# Patient Record
Sex: Male | Born: 1935 | Race: White | Hispanic: No | Marital: Married | Smoking: Never smoker
Health system: Southern US, Community
[De-identification: ages and names within clinical notes are randomized; demographics above are authoritative.]

## PROBLEM LIST (undated history)

## (undated) DIAGNOSIS — M1990 Unspecified osteoarthritis, unspecified site: Secondary | ICD-10-CM

## (undated) DIAGNOSIS — I2510 Atherosclerotic heart disease of native coronary artery without angina pectoris: Secondary | ICD-10-CM

## (undated) DIAGNOSIS — N189 Chronic kidney disease, unspecified: Secondary | ICD-10-CM

## (undated) DIAGNOSIS — E785 Hyperlipidemia, unspecified: Secondary | ICD-10-CM

## (undated) DIAGNOSIS — I714 Abdominal aortic aneurysm, without rupture, unspecified: Secondary | ICD-10-CM

## (undated) DIAGNOSIS — I10 Essential (primary) hypertension: Secondary | ICD-10-CM

## (undated) DIAGNOSIS — K219 Gastro-esophageal reflux disease without esophagitis: Secondary | ICD-10-CM

## (undated) DIAGNOSIS — I219 Acute myocardial infarction, unspecified: Secondary | ICD-10-CM

## (undated) HISTORY — DX: Atherosclerotic heart disease of native coronary artery without angina pectoris: I25.10

## (undated) HISTORY — DX: Abdominal aortic aneurysm, without rupture, unspecified: I71.40

## (undated) HISTORY — PX: FRACTURE SURGERY: SHX138

## (undated) HISTORY — PX: EYE SURGERY: SHX253

## (undated) HISTORY — DX: Abdominal aortic aneurysm, without rupture: I71.4

## (undated) HISTORY — PX: OTHER SURGICAL HISTORY: SHX169

## (undated) HISTORY — PX: FINGER AMPUTATION: SHX636

## (undated) HISTORY — PX: FINGER SURGERY: SHX640

## (undated) HISTORY — PX: CHOLECYSTECTOMY: SHX55

---

## 1980-08-04 DIAGNOSIS — I219 Acute myocardial infarction, unspecified: Secondary | ICD-10-CM

## 1980-08-04 HISTORY — DX: Acute myocardial infarction, unspecified: I21.9

## 2000-05-27 ENCOUNTER — Ambulatory Visit (HOSPITAL_COMMUNITY): Admission: RE | Admit: 2000-05-27 | Discharge: 2000-05-27 | Payer: Self-pay | Admitting: *Deleted

## 2001-07-14 ENCOUNTER — Encounter: Payer: Self-pay | Admitting: Nephrology

## 2001-07-14 ENCOUNTER — Ambulatory Visit (HOSPITAL_COMMUNITY): Admission: RE | Admit: 2001-07-14 | Discharge: 2001-07-14 | Payer: Self-pay | Admitting: Nephrology

## 2010-10-17 ENCOUNTER — Emergency Department (HOSPITAL_COMMUNITY): Payer: Medicare Other

## 2010-10-17 ENCOUNTER — Inpatient Hospital Stay (HOSPITAL_COMMUNITY)
Admission: EM | Admit: 2010-10-17 | Discharge: 2010-10-20 | DRG: 383 | Disposition: A | Discharge status: Home / Self Care | Payer: Medicare Other | Attending: Internal Medicine | Admitting: Internal Medicine

## 2010-10-17 DIAGNOSIS — K219 Gastro-esophageal reflux disease without esophagitis: Secondary | ICD-10-CM | POA: Diagnosis present

## 2010-10-17 DIAGNOSIS — Z7982 Long term (current) use of aspirin: Secondary | ICD-10-CM

## 2010-10-17 DIAGNOSIS — I739 Peripheral vascular disease, unspecified: Secondary | ICD-10-CM | POA: Diagnosis present

## 2010-10-17 DIAGNOSIS — K269 Duodenal ulcer, unspecified as acute or chronic, without hemorrhage or perforation: Principal | ICD-10-CM | POA: Diagnosis present

## 2010-10-17 DIAGNOSIS — N189 Chronic kidney disease, unspecified: Secondary | ICD-10-CM | POA: Diagnosis present

## 2010-10-17 DIAGNOSIS — I2510 Atherosclerotic heart disease of native coronary artery without angina pectoris: Secondary | ICD-10-CM | POA: Diagnosis present

## 2010-10-17 DIAGNOSIS — I129 Hypertensive chronic kidney disease with stage 1 through stage 4 chronic kidney disease, or unspecified chronic kidney disease: Secondary | ICD-10-CM | POA: Diagnosis present

## 2010-10-17 DIAGNOSIS — K259 Gastric ulcer, unspecified as acute or chronic, without hemorrhage or perforation: Secondary | ICD-10-CM | POA: Diagnosis present

## 2010-10-17 DIAGNOSIS — I714 Abdominal aortic aneurysm, without rupture, unspecified: Secondary | ICD-10-CM | POA: Diagnosis present

## 2010-10-17 DIAGNOSIS — E785 Hyperlipidemia, unspecified: Secondary | ICD-10-CM | POA: Diagnosis present

## 2010-10-17 DIAGNOSIS — I498 Other specified cardiac arrhythmias: Secondary | ICD-10-CM | POA: Diagnosis not present

## 2010-10-17 DIAGNOSIS — N179 Acute kidney failure, unspecified: Secondary | ICD-10-CM | POA: Diagnosis not present

## 2010-10-17 DIAGNOSIS — K5900 Constipation, unspecified: Secondary | ICD-10-CM | POA: Diagnosis present

## 2010-10-17 DIAGNOSIS — D509 Iron deficiency anemia, unspecified: Secondary | ICD-10-CM | POA: Diagnosis present

## 2010-10-17 DIAGNOSIS — K859 Acute pancreatitis without necrosis or infection, unspecified: Secondary | ICD-10-CM | POA: Diagnosis present

## 2010-10-17 LAB — CBC
HCT: 34.5 % — ABNORMAL LOW (ref 39.0–52.0)
Hemoglobin: 11.6 g/dL — ABNORMAL LOW (ref 13.0–17.0)
MCH: 30.5 pg (ref 26.0–34.0)
MCHC: 33.6 g/dL (ref 30.0–36.0)
MCV: 90.8 fL (ref 78.0–100.0)
Platelets: 244 10*3/uL (ref 150–400)
RBC: 3.8 MIL/uL — ABNORMAL LOW (ref 4.22–5.81)
RDW: 13.2 % (ref 11.5–15.5)
WBC: 10.9 10*3/uL — ABNORMAL HIGH (ref 4.0–10.5)

## 2010-10-17 LAB — URINALYSIS, ROUTINE W REFLEX MICROSCOPIC
Bilirubin Urine: NEGATIVE
Glucose, UA: NEGATIVE mg/dL
Hgb urine dipstick: NEGATIVE
Ketones, ur: NEGATIVE mg/dL
Nitrite: NEGATIVE
Protein, ur: NEGATIVE mg/dL
Specific Gravity, Urine: 1.017 (ref 1.005–1.030)
Urobilinogen, UA: 0.2 mg/dL (ref 0.0–1.0)
pH: 5.5 (ref 5.0–8.0)

## 2010-10-17 LAB — COMPREHENSIVE METABOLIC PANEL
ALT: 8 U/L (ref 0–53)
AST: 15 U/L (ref 0–37)
CO2: 25 mEq/L (ref 19–32)
Chloride: 99 mEq/L (ref 96–112)
GFR calc Af Amer: 31 mL/min — ABNORMAL LOW (ref >60)
GFR calc non Af Amer: 26 mL/min — ABNORMAL LOW (ref >60)
Glucose, Bld: 127 mg/dL — ABNORMAL HIGH (ref 70–99)
Sodium: 135 mEq/L (ref 135–145)
Total Bilirubin: 1 mg/dL (ref 0.3–1.2)

## 2010-10-17 LAB — DIFFERENTIAL
Basophils Absolute: 0 10*3/uL (ref 0.0–0.1)
Basophils Relative: 0 % (ref 0–1)
Eosinophils Absolute: 0.1 10*3/uL (ref 0.0–0.7)
Eosinophils Relative: 1 % (ref 0–5)
Lymphocytes Relative: 16 % (ref 12–46)
Lymphs Abs: 1.7 10*3/uL (ref 0.7–4.0)
Monocytes Absolute: 0.8 10*3/uL (ref 0.1–1.0)
Monocytes Relative: 7 % (ref 3–12)
Neutro Abs: 8.4 10*3/uL — ABNORMAL HIGH (ref 1.7–7.7)
Neutrophils Relative %: 77 % (ref 43–77)

## 2010-10-17 LAB — COMPREHENSIVE METABOLIC PANEL WITH GFR
Albumin: 3.7 g/dL (ref 3.5–5.2)
Alkaline Phosphatase: 84 U/L (ref 39–117)
BUN: 49 mg/dL — ABNORMAL HIGH (ref 6–23)
Calcium: 9.2 mg/dL (ref 8.4–10.5)
Creatinine, Ser: 2.46 mg/dL — ABNORMAL HIGH (ref 0.4–1.5)
Potassium: 4 meq/L (ref 3.5–5.1)
Total Protein: 7.3 g/dL (ref 6.0–8.3)

## 2010-10-17 LAB — CK TOTAL AND CKMB (NOT AT ARMC): Relative Index: INVALID (ref 0.0–2.5)

## 2010-10-17 LAB — AMYLASE: Amylase: 41 U/L (ref 0–105)

## 2010-10-17 LAB — TROPONIN I: Troponin I: 0.01 ng/mL (ref 0.00–0.06)

## 2010-10-17 LAB — OCCULT BLOOD, POC DEVICE: Fecal Occult Bld: POSITIVE

## 2010-10-17 LAB — LIPASE, BLOOD: Lipase: 36 U/L (ref 11–59)

## 2010-10-18 ENCOUNTER — Inpatient Hospital Stay (HOSPITAL_COMMUNITY): Payer: Medicare Other

## 2010-10-18 DIAGNOSIS — R933 Abnormal findings on diagnostic imaging of other parts of digestive tract: Secondary | ICD-10-CM

## 2010-10-18 DIAGNOSIS — K859 Acute pancreatitis without necrosis or infection, unspecified: Secondary | ICD-10-CM

## 2010-10-18 DIAGNOSIS — R1033 Periumbilical pain: Secondary | ICD-10-CM

## 2010-10-18 DIAGNOSIS — R195 Other fecal abnormalities: Secondary | ICD-10-CM

## 2010-10-18 LAB — COMPREHENSIVE METABOLIC PANEL
Albumin: 2.9 g/dL — ABNORMAL LOW (ref 3.5–5.2)
BUN: 44 mg/dL — ABNORMAL HIGH (ref 6–23)
CO2: 25 mEq/L (ref 19–32)
Chloride: 107 mEq/L (ref 96–112)
Creatinine, Ser: 2.22 mg/dL — ABNORMAL HIGH (ref 0.4–1.5)
GFR calc non Af Amer: 29 mL/min — ABNORMAL LOW (ref >60)
Total Bilirubin: 0.8 mg/dL (ref 0.3–1.2)

## 2010-10-18 LAB — GLUCOSE, CAPILLARY
Glucose-Capillary: 105 mg/dL — ABNORMAL HIGH (ref 70–99)
Glucose-Capillary: 89 mg/dL (ref 70–99)
Glucose-Capillary: 100 mg/dL — ABNORMAL HIGH (ref 70–99)

## 2010-10-18 LAB — CARDIAC PANEL(CRET KIN+CKTOT+MB+TROPI)
CK, MB: 1.5 ng/mL (ref 0.3–4.0)
Relative Index: INVALID (ref 0.0–2.5)
Total CK: 54 U/L (ref 7–232)
Total CK: 59 U/L (ref 7–232)
Troponin I: 0.02 ng/mL (ref 0.00–0.06)

## 2010-10-18 LAB — CBC
MCH: 29.9 pg (ref 26.0–34.0)
MCV: 91 fL (ref 78.0–100.0)
Platelets: 193 10*3/uL (ref 150–400)
RBC: 3.24 MIL/uL — ABNORMAL LOW (ref 4.22–5.81)

## 2010-10-18 LAB — FERRITIN: Ferritin: 101 ng/mL (ref 22–322)

## 2010-10-19 ENCOUNTER — Other Ambulatory Visit: Payer: Self-pay | Admitting: Gastroenterology

## 2010-10-19 LAB — LIPID PANEL
Cholesterol: 197 mg/dL (ref 0–200)
LDL Cholesterol: 141 mg/dL — ABNORMAL HIGH (ref 0–99)
Total CHOL/HDL Ratio: 6.4 RATIO
Triglycerides: 125 mg/dL (ref <150)

## 2010-10-19 LAB — CBC
MCH: 29.8 pg (ref 26.0–34.0)
Platelets: 202 10*3/uL (ref 150–400)
RBC: 3.26 MIL/uL — ABNORMAL LOW (ref 4.22–5.81)

## 2010-10-19 LAB — BASIC METABOLIC PANEL
Calcium: 8.8 mg/dL (ref 8.4–10.5)
Chloride: 107 mEq/L (ref 96–112)
Creatinine, Ser: 1.85 mg/dL — ABNORMAL HIGH (ref 0.4–1.5)
GFR calc Af Amer: 43 mL/min — ABNORMAL LOW (ref >60)
Sodium: 139 mEq/L (ref 135–145)

## 2010-10-19 LAB — GLUCOSE, CAPILLARY
Glucose-Capillary: 89 mg/dL (ref 70–99)
Glucose-Capillary: 76 mg/dL (ref 70–99)
Glucose-Capillary: 108 mg/dL — ABNORMAL HIGH (ref 70–99)
Glucose-Capillary: 138 mg/dL — ABNORMAL HIGH (ref 70–99)

## 2010-10-20 LAB — GLUCOSE, CAPILLARY
Glucose-Capillary: 115 mg/dL — ABNORMAL HIGH (ref 70–99)
Glucose-Capillary: 125 mg/dL — ABNORMAL HIGH (ref 70–99)

## 2010-10-20 LAB — TYPE AND SCREEN
Unit division: 0
Unit division: 0

## 2010-10-20 LAB — CBC
Platelets: 206 10*3/uL (ref 150–400)
RBC: 3.13 MIL/uL — ABNORMAL LOW (ref 4.22–5.81)
WBC: 6.2 10*3/uL (ref 4.0–10.5)

## 2010-10-20 LAB — BASIC METABOLIC PANEL
Chloride: 110 mEq/L (ref 96–112)
GFR calc Af Amer: 50 mL/min — ABNORMAL LOW (ref >60)
Potassium: 3.7 mEq/L (ref 3.5–5.1)

## 2010-10-25 NOTE — Discharge Summary (Signed)
NAMEKRISTAN, [name]                ACCOUNT NO.:  [id number]  MEDICAL RECORD NO.:  [id number]           PATIENT TYPE:  I  LOCATION:  3735                         FACILITY:  [facility]  PHYSICIAN:  Jeremy Harvest, MD    DATE OF BIRTH:  Sep 01, 1930  DATE OF ADMISSION:  10/17/2010 DATE OF DISCHARGE:  10/20/2010                        DISCHARGE SUMMARY - REFERRING   PRIMARY CARE PHYSICIAN:  Dr. Simone [name] in [place], [place].  CARDIOLOGIST:  Dr. Dulce [name] with [facility] in [place]. Gastroenterologist: Dr [name]  DISCHARGE DIAGNOSES: 1. Abdominal pain, secondary to large postbulbar duodenal ulcer,     improved. 2. Questionable pancreatitis, likely secondary to inflammation,     secondary to problem #1. 3. Asymptomatic bradycardia. 4. Acute-on-chronic kidney disease, baseline unknown, improved. 5. Iron-deficiency anemia.  H and H stable. 6. Hypertension. 7. Hyperlipidemia. 8. Abdominal aortic aneurysm. 9. Gastroesophageal reflux disease. 10.History of peptic ulcer disease. 11.History of coronary artery disease in 1980, managed conservatively     civilly per admission H and P. 12.History of peripheral vascular disease. 13.Status post cholecystectomy. 14.Status post bilateral renal artery stents placed 4 years ago.  DISCHARGE MEDICATIONS: 1. Colace 100 mg 2 tablets p.o. b.i.d. 2. Protonix 40 mg p.o. b.i.d. 3. Folic acid 400 mcg 1 tablets p.o. daily. 4. HCTZ 25 mg p.o. daily to resume in the next 3-4 days. 5. Lipitor 40 mg p.o. at bedtime. 6. Lotrel 10/20 one tablet p.o. daily to resume in the next 3-4 days. 7. Multivitamin 1 tablet p.o. daily. 8. Niacin 500 mg 4 tablets p.o. at bedtime. 9. Enteric coated aspirin 81 mg p.o. daily to begin in 1 month. 10.Vitamin B complex 1 tablet p.o. daily.  DISPOSITION AND FOLLOWUP:  The patient will be discharged home.  The patient is to follow up with his PCP in the next 1-2 weeks.  On followup, the patient will need a  BMET checked to follow up on his electrolytes and renal function.  The patient will also need a CBC checked to follow up on his H and H.  The patient's blood pressure will need to be reassessed.  His atenolol was discontinued secondary to asymptomatic bradycardia with heart rate in the 40s, and his blood pressure medications will be resumed in the next 3-4 days, secondary to acute-on-chronic kidney disease.  The patient is also to follow up with his gastroenterologist, Dr. Lynann [name] in 1 week to follow up on his large postbulbar duodenal ulcer and his anemia.  The patient will need a CBC drawn in 1 week.  He will follow up with his GI doctor in 1 week. He will need a CBC done to follow up on his H and H.  The patient has been put on Protonix 40 mg twice daily.  He will be discharged home on a low-residue diet as well as placed on Colace 200 mg twice daily for constipation.  The patient's aspirin has been discontinued.  He will resume his enteric-coated aspirin of 81 mg daily in 1 month per GI recommendations.  The patient is to also follow up with his cardiologist in 2 weeks.  On followup, the  patient's cardiac issues will need to be reassessed.  The patient's atenolol was discontinued, secondary to his asymptomatic bradycardia and his right bundle-branch block noted on EKG. The patient's aspirin has been discontinued for the next month.  He may resume back on enteric-coated aspirin 81 mg daily in 1 month, secondary to his large postbulbar duodenal ulcer.  The patient is also to follow up with his nephrologist as scheduled.  The patient's gastroenterologist will need to follow up on biopsies, which were taking during the EGD and if positive for H. pylori, the patient will likely require treatment.  CONSULTATIONS DONE:  A GI consultation was done.  The patient was seen in consultation by Dr. Lina [name] on October 18, 2010 and subsequently followed throughout the hospitalization by Dr.  Charna [name] of Gastroenterology.  PROCEDURES PERFORMED: 1. A CT of the abdomen and pelvis was done on October 17, 2010 that     showed findings, most consistent with acute pancreatitis involving     the head of the pancreas, abdominal aortic aneurysm with maximal     diameter of 4.9 cm.  Prostate is prominent, correlate with PSA on     physical exam. 2. Abdominal ultrasound was done on October 18, 2010 that showed     atherosclerosis of the abdominal aorta with an infrarenal abdominal     aortic aneurysm.  Maximal transverse measurement of ultrasound is     4.2 x 4.3 cm.  CT showed a slightly larger measurement at 4.3 x 4.9     cm, previous cholecystectomy; echogenic liver with slightly     lobulated margins, could be due to fatty change or early cirrhosis;     renal cortical atrophia bilaterally, increased echogenicity,     consistent with chronic renal parenchymal disease, no obstruction;     small benign appearing cyst on the left; small nonobstructing stone     on the right. 3. The patient also had a upper endoscopy which was done on October 19, 2010 by Dr. Charna [name] that showed a normal-appearing esophagus     and GE junction, antral ulcers were noted.  Biopsies were done to     rule out H. Pylori, small hiatal hernia, large postbulbar duodenal     ulcer.  No visible vessel noted.  ADMISSION HISTORY AND PHYSICAL:  [name] is a 77 year old Caucasian gentleman with known history of abdominal aortic aneurysm, hyperlipidemia, hypertension, peptic ulcer disease, chronic kidney disease, peripheral vascular disease, who presented with complaints of abdominal pain.  The patient stated that his abdominal pain had been ongoing for the past 6-7 days, increased in intensity, originally started as bilateral lower quadrant pain.  In the past 2 days, it is more in the epigastric area.  Pain as constant, has no relation to food. The patient is not eating well, stated that it may  increase his pain. The patient also has constipation.  He did use a Fleet enema at home 2 days prior to admission, which gave good results.  In the ED, the patient had a CT scan of the abdomen and pelvis, which showed pancreatitis, also did show known abdominal aortic aneurysm.  The patient was admitted for further management of his acute pancreatitis. The patient denied any chest pain or shortness of breath.  He had some nausea, but did not throw out.  Denied any dizziness.  No loss of consciousness or focal deficit.  Denied any fever or chills.  No cough. No phlegm.  No dysuria.  No discharges or diarrhea.  For the rest of the admission history and physical, please see H and P dictated per Dr. Toniann Fail of job [id number].  HOSPITAL COURSE: 1. Abdominal pain.  The patient was admitted with abdominal pain.     Initially, it was felt to be likely secondary to an acute     pancreatitis per CT scan findings.  However, lipase levels which     were obtained were within normal limits.  The patient was placed on     bowel rest with symptomatic treatment with antiemetics.  He was     placed on aggressive IV fluid hydration.  He was monitored with     pain control.  The patient was followed.  The patient's hemoglobin     decreased during the hospitalization and subsequently a GI     consultation was obtained.  The patient was seen in initial     consultation by Dr. Lina [name] of [facility] on October 18, 2010,     who recommended to keep the patient on sips and chips.  The patient     was to continue proton pump inhibitor twice daily.  He had been     placed on IV to avoid exacerbate of his history of peptic ulcer     disease.  The patient subsequently underwent an upper endoscopy,     which was performed on October 19, 2010 per Dr. Charna [name] with     results as stated above.  Upper endoscopy did show antral ulcers.     Biopsies were obtained, which showed a small hiatal hernia and a     large  postbulbar duodenal ulcer with no visible vessels noted.  The     patient was to continue on the proton pump inhibitors twice daily     to avoid NSAIDs and to avoid aspirin for the next month and     restarted on aspirin to be an enteric-coated aspirin at 81 mg     daily.  The patient was placed on a low-residue renal diet, which     he tolerated well.  The patient did not have any further abdominal     pain.  The patient's hemoglobin stabilized and was felt the     patient's abdominal pain was likely secondary to the postbulbar     large duodenal ulcer and it was felt that the CT scan findings that     were noted for acute pancreatitis was secondary to stranding and     inflammation from the duodenal ulcer.  The patient improved     clinically, did not have any further abdominal pain.  He will be     discharged in stable and improved condition.  The patient will need     to follow up with his gastroenterologist in 1 week post discharge,     where a CBC will need to be obtained on followup and biopsies,     which were taking during the EGD will need to be followed up upon     as if H. pylori positive, will require treatment.  The patient will     be discharged home on twice daily Protonix, also on Colace 100 mg     tablets, 2 tablets twice daily for constipation.  The patient is to     be on a low-residue diet for at least the next month prior to     advancing.  The patient remained  in stable condition.  He will be     discharged home in stable and improved condition. 2. Questionable pancreatitis.  On admission, the patient was admitted     per CT scan finding for an acute pancreatitis.  He was placed on     bowel rest, hydrated aggressively with IV fluids, and pain     management.  However, on admission, the patient's lipase levels     were within normal limits.  Abdominal ultrasound which was obtained     to follow up on AAA was negative for any gallstone pancreatitis.     Fasting lipid  panel which was obtained had normal triglyceride     levels.  The patient subsequently went upper EGD, was noted to have     a large postbulbar duodenal ulcer, which was felt to be the cause     of the CT findings, which did show some stranding, which was     apparently consistent with pancreatitis.  However, it was felt,     most of the patient's abdominal pain was secondary to problem #1,     and CT scan findings noted were secondary to large duodenal ulcer     that was noted.  The patient improved clinically.  His diet was     advanced.  He tolerated.  He did not have any further abdominal     pain.  He will be discharged home in stable and improved condition. 3. Asymptomatic bradycardia.  During the hospitalization, the patient     was noted to be bradycardic with heart rate in the 40s.  However,     on ambulation, his heart rate improved into the mid 50s.  The     patient remained asymptomatic.  The patient however stated that he     did have a chronic bradycardia.  The patient was noted to be on     atenolol.  EKG which was done during the hospitalization for workup     of his asymptomatic bradycardia did show a right bundle-branch     block, borderline AV conduction delay with a PR interval of 200.  A     cardiac enzymes which were cycled were negative x3.  A 2-D echo was     ordered, however was never done during this hospitalization.  The     patient remained stable.  The patient will follow up with his     cardiologist as an outpatient.  The patient's atenolol has been     discontinued, secondary to his EKG findings and his asymptomatic     bradycardia.  The patient will follow up with his cardiologist as     an outpatient. 4. Acute-on-chronic kidney disease.  On admission, the patient was     noted to be in acute-on-chronic kidney disease with a creatinine of     2.46, baseline was unknown.  Orders were placed to try and get     records from his nephrologist office.  However,  records were not     available.  It was felt this was secondary to a prerenal azotemia,     secondary to volume depletion.  His ACE inhibitor was held.  His     blood pressure regimen was held and his HCTZ was held.  The patient     was hydrated with IV fluids with improvement in his renal function     such that by day of discharge, his creatinine was down to 1.62.  The patient will resume his HCTZ and his ACE inhibitor in the next     3-4 days.  He will follow up with his PCP as an outpatient where a     BMET will need to be obtained to follow up on his electrolytes and     renal function, and the patient, if needed, may follow up with his     nephrologist earlier than scheduled.  The patient was discharged in     stable and improved condition. 5. Iron-deficiency anemia.  On admission, the patient was noted to be     anemic.  On admission, his hemoglobin was 11.6, however with     hydration, his hemoglobin dropped down to the mid 9s.  FOBT which     was obtained was positive and as such a GI consultation was     obtained.  The patient was seen in consultation by Dr. Lina [name]     on October 18, 2010.  The patient subsequently underwent an upper EGD     with results as stated above, which did show some antral ulcers as     well as a large postbulbar duodenal ulcer per EGD.  However, no     visible bleeding vessel.  The patient was followed throughout the     hospitalization by Dr. Charna [name].  The patient's hemoglobin was     monitored and his hemoglobin stabilized during the hospitalization.     He will follow up with his gastroenterologist as an outpatient.     The patient has been placed on twice daily PPI, has been told to     avoid all NSAIDs, and to hold off on his aspirin for the next     month.  When the patient resumes his aspirin, he will resume with     enteric-coated aspirin at 81 mg daily. 6. AAA.  On admission, the patient was noted to have a history of     abdominal  aortic aneurysm, which the patient did state was measured     2 months prior to admission, which was above 4.6.  CT scan did show     his abdominal aortic aneurysm measuring 4.9 x 4.3 cm and     subsequently abdominal ultrasound was obtained.  Abdominal     ultrasound which was obtained did show an infrarenal abdominal     aortic aneurysm measuring about 4.2 x 4.3 cm.  The patient's     hemoglobin stabilized.  The patient remained in stable condition,     was asymptomatic, will follow up with his PCP as an outpatient.  The rest of the patient's chronic medical issues remained stable throughout the hospitalization.  The patient was discharged in stable and improved condition.  On the day of discharge, vital signs; temperature 97.9, pulse of 50, respirations 18, blood pressure 132/67, satting 97% on room air.  DISCHARGE LABORATORY DATA:  Sodium 138, potassium 3.7, chloride 110, bicarb 25, glucose 113, BUN 23, creatinine 1.69, calcium of 8.4.  CBC with a white count of 6.2, hemoglobin 9.2, hematocrit 28.2, and a platelet count of 206.  It has been a pleasure taking care of [name].     Jeremy Harvest, MD     DT/MEDQ  D:  10/20/2010  T:  10/20/2010  Job:  [id number]  cc:   [name] Dr. Marigene Ehlers. Caryn Section, M.D. Anselmo Rod, MD, [facility]  Electronically Signed by Jeremy Harvest MD on  10/25/2010 12:16:16 PM

## 2010-11-04 NOTE — H&P (Signed)
NAMEKRISTOFFER, [name]                ACCOUNT NO.:  [id number]  MEDICAL RECORD NO.:  [id number]           PATIENT TYPE:  E  LOCATION:  [facility]                         FACILITY:  [facility]  PHYSICIAN:  Eduard Clos, MDDATE OF BIRTH:  July 03, 1931  DATE OF ADMISSION:  10/17/2010 DATE OF DISCHARGE:                             HISTORY & PHYSICAL   PRIMARY CARE PHYSICIAN:  At [place] Dr. Nedra Hai.  PRIMARY NEPHROLOGIST:  Wilber Bihari. Caryn Section, MD  CHIEF COMPLAINT:  Abdominal pain.  HISTORY OF PRESENT ILLNESS:  A 77 year old male with known history of abdominal aortic aneurysm, hyperlipidemia, hypertension, peptic ulcer disease, chronic kidney disease, peripheral vascular disease presented with complaint of abdominal pain.  The patient stated the abdominal pain has been ongoing for the last 6-7 days increasing in intensity, and it originally started of as bilateral lower quadrant pain.  In last 2 days, it is more in the epigastric area.  The pain is constant, has no relation to food.  The patient is not eating well, scared that it may increase the pain.  The patient also has constipation.  He did use Fleet Enema at home 2 days ago, which gave good results.  In the ER, the patient had a CAT scan of the abdomen and pelvis, which shows pancreatitis, and it also does show his known abdominal aortic aneurysm. The patient is admitted for further management of his acute pancreatitis.  The patient denies any chest pain, shortness of breath, has had some nausea, did not throw up.  Denies any dizziness, loss of consciousness, any focal deficit.  Denies any fever or chills, cough or phlegm.  Denies any dysuria, discharges, or diarrhea.  PAST MEDICAL HISTORY:  Abdominal aortic aneurysm.  The patient stated his last measure was 2 months ago measuring around 4.6 cm.  He is not sure exactly of the size.  History of CAD in 1980 was managed conservatively, history of hyperlipidemia, hypertension, peptic  ulcer disease.  He had an EGD 5 years ago, history of peripheral vascular disease.  PAST SURGICAL HISTORY:  Cholecystectomy and bilateral renal artery stents placed 4 years ago.  MEDICATIONS ON ADMISSION:  Doses are not known.  Medicines are aspirin, atenolol, folic acid, hydrochlorothiazide, Lipitor, Lotrel, multivitamin, niacin, Prilosec.  ALLERGIES:  PLAVIX causes hives.  FAMILY HISTORY:  Negative for MI, diabetes, stroke, or cancer as per patient.  SOCIAL HISTORY:  The patient is married, lives with his wife.  He is a full code.  Denies smoking cigarettes, drinking alcohol, or using illegal drugs.  REVIEW OF SYSTEMS:  As per history of present illness, nothing else significant.  PHYSICAL EXAMINATION:  GENERAL:  The patient examined at bedside, not in acute distress. VITAL SIGNS:  Blood pressure is 106/50, pulse is around 46-49 per minute, temperature 98.1, respirations 18, O2 sat 96%. HEENT:  Anicteric.  No pallor.  No discharge from ears, eyes, nose, or mouth. CHEST:  Bilateral air entry present.  No rhonchi.  No Crepitation. HEART:  S1 and S2 heard. ABDOMEN:  Soft, nontender.  Bowel sounds heard.  The patient states that to few hours ago when they palpated the  abdomen, it was quite painful for him.  At this time, it is completely changed.  There is no discoloration. CNS:  Alert, awake, and oriented to time, place, and person.  Moves upper and lower extremities 5/5. EXTREMITIES:  Peripheral pulses felt.  No edema.  LABORATORY DATA:  CT of abdomen and pelvis without contrast shows findings are most consistent with acute pancreatitis involving the head of the pancreas, abdominal aortic aneurysm with maximal diameter of 4.9 cm.  Prostate is prominent, correlate with PAC and physical exam.  CBC, WBC is 10.9, hemoglobin is 11.3, hematocrit 34.5, platelets 244. Complete metabolic panel, sodium 135, potassium 4, chloride 99, carbon dioxide 25, glucose 127, BUN 29,  creatinine 2.4, alkaline phosphatase 84, AST and ALT is 15 and 8, total protein is 7.3, albumin 3.7, calcium 9.2, amylase is 41, lipase is 36.  UA is negative for nitrite and leukocyte esterase.  Fecal occult blood is positive.  ASSESSMENT: 1. Abdominal pain most likely from acute pancreatitis as per the CAT     scan findings. 2. Anemia with stool guaiac positive and recent use of nonsteroidal     antiinflammatory drugs with a previous history of gastric ulcer. 3. Known history of abdominal aortic aneurysm that is 4.9 cm. 4. History of hypertension. 5. History of chronic kidney disease.  Baseline creatinine not known.     History of bilateral renal artery stents. 6. History of coronary artery disease. 7. History of peripheral vascular disease. 8. Hyperlipidemia. 9. Sinus bradycardia.  PLAN: 1. At this time, admit the patient to telemetry. 2. For his acute pancreatitis, the patient will be kept n.p.o.  We are     going to restart diet in the morning if there is pain and there is     no nausea and slowly advance. 3. History of abdominal aortic aneurysm.  At this time, the patient     states his known size last time the measure was 4.6.  We are not     exactly sure.  I want to get a sonogram of the abdomen in the     morning to check the abdominal aortic aneurysm size to make sure it     is not enlarging, and also at the same time we will check for     common bile duct. 4. Anemia with stool guaiac positive.  We are going to recheck CBC in     another 2 hours.  We would type and screen 2 units in whole.  I am     going to place the patient on Protonix, and recheck another CBC in     a.m.  If there is any significant fall, we need to transfuse.  I     did discuss with Dr. Christella Hartigan on call for Gastroenterology, as the     patient has acute pancreatitis at this time.  If there is any     significant fall in the hemoglobin, Gastroenterology should be     called back again, otherwise he  will need outpatient followup on     this. 5. Sinus bradycardia.  EKG shows right bundle-branch block with     nonspecific ST-T changes.  Heart rate is around 49 beats per     minutes.  We are going to closely follow his heart rate at this     time.  The patient is on atenolol.  We will keep some holding     parameters, and for his p.r.n. hypertension, we will avoid  hydralazine as the patient has abdominal aortic aneurysm, and as he     has bradycardia, we are not able to give labetalol as p.r.n.     We will give at this time Vasotec.  At the same time, we have to     closely follow his creatinine as the patient does have chronic     kidney disease. 6. Further recommendation as condition evolves.     Eduard Clos, MD     ANK/MEDQ  D:  10/17/2010  T:  10/17/2010  Job:  [id number]  cc:   Dr. Ula Lingo F. Caryn Section, M.D.  Electronically Signed by Midge Minium MD on 11/04/2010 07:53:32 AM

## 2010-11-09 ENCOUNTER — Emergency Department (HOSPITAL_COMMUNITY): Payer: Medicare Other

## 2010-11-09 ENCOUNTER — Inpatient Hospital Stay (HOSPITAL_COMMUNITY)
Admission: EM | Admit: 2010-11-09 | Discharge: 2010-11-12 | DRG: 392 | Disposition: A | Discharge status: Home / Self Care | Payer: Medicare Other | Attending: Internal Medicine | Admitting: Internal Medicine

## 2010-11-09 DIAGNOSIS — N189 Chronic kidney disease, unspecified: Secondary | ICD-10-CM | POA: Diagnosis present

## 2010-11-09 DIAGNOSIS — N179 Acute kidney failure, unspecified: Secondary | ICD-10-CM | POA: Diagnosis present

## 2010-11-09 DIAGNOSIS — I739 Peripheral vascular disease, unspecified: Secondary | ICD-10-CM | POA: Diagnosis present

## 2010-11-09 DIAGNOSIS — I129 Hypertensive chronic kidney disease with stage 1 through stage 4 chronic kidney disease, or unspecified chronic kidney disease: Secondary | ICD-10-CM | POA: Diagnosis present

## 2010-11-09 DIAGNOSIS — I498 Other specified cardiac arrhythmias: Secondary | ICD-10-CM | POA: Diagnosis present

## 2010-11-09 DIAGNOSIS — Z7982 Long term (current) use of aspirin: Secondary | ICD-10-CM

## 2010-11-09 DIAGNOSIS — K219 Gastro-esophageal reflux disease without esophagitis: Secondary | ICD-10-CM | POA: Diagnosis present

## 2010-11-09 DIAGNOSIS — E785 Hyperlipidemia, unspecified: Secondary | ICD-10-CM | POA: Diagnosis present

## 2010-11-09 DIAGNOSIS — K2970 Gastritis, unspecified, without bleeding: Principal | ICD-10-CM | POA: Diagnosis present

## 2010-11-09 DIAGNOSIS — R1013 Epigastric pain: Secondary | ICD-10-CM | POA: Diagnosis present

## 2010-11-09 DIAGNOSIS — Z23 Encounter for immunization: Secondary | ICD-10-CM

## 2010-11-09 DIAGNOSIS — D509 Iron deficiency anemia, unspecified: Secondary | ICD-10-CM | POA: Diagnosis present

## 2010-11-09 DIAGNOSIS — I252 Old myocardial infarction: Secondary | ICD-10-CM

## 2010-11-09 DIAGNOSIS — I2510 Atherosclerotic heart disease of native coronary artery without angina pectoris: Secondary | ICD-10-CM | POA: Diagnosis present

## 2010-11-09 DIAGNOSIS — K3189 Other diseases of stomach and duodenum: Secondary | ICD-10-CM | POA: Diagnosis present

## 2010-11-09 DIAGNOSIS — E871 Hypo-osmolality and hyponatremia: Secondary | ICD-10-CM | POA: Diagnosis not present

## 2010-11-09 LAB — LIPID PANEL
HDL: 39 mg/dL — ABNORMAL LOW (ref >39)
Total CHOL/HDL Ratio: 3.3 RATIO
Triglycerides: 161 mg/dL — ABNORMAL HIGH (ref <150)
VLDL: 32 mg/dL (ref 0–40)

## 2010-11-09 LAB — LIPASE, BLOOD: Lipase: 20 U/L (ref 11–59)

## 2010-11-09 LAB — COMPREHENSIVE METABOLIC PANEL
ALT: 16 U/L (ref 0–53)
Alkaline Phosphatase: 98 U/L (ref 39–117)
Glucose, Bld: 148 mg/dL — ABNORMAL HIGH (ref 70–99)
Potassium: 3.7 mEq/L (ref 3.5–5.1)
Sodium: 132 mEq/L — ABNORMAL LOW (ref 135–145)
Total Protein: 7.6 g/dL (ref 6.0–8.3)

## 2010-11-09 LAB — DIFFERENTIAL
Basophils Absolute: 0 10*3/uL (ref 0.0–0.1)
Lymphocytes Relative: 19 % (ref 12–46)
Neutro Abs: 7.9 10*3/uL — ABNORMAL HIGH (ref 1.7–7.7)
Neutrophils Relative %: 74 % (ref 43–77)

## 2010-11-09 LAB — URINALYSIS, ROUTINE W REFLEX MICROSCOPIC
Glucose, UA: NEGATIVE mg/dL
Hgb urine dipstick: NEGATIVE
Ketones, ur: NEGATIVE mg/dL
Nitrite: NEGATIVE
pH: 5 (ref 5.0–8.0)

## 2010-11-09 LAB — CBC
HCT: 40.1 % (ref 39.0–52.0)
Hemoglobin: 13.4 g/dL (ref 13.0–17.0)
RDW: 13 % (ref 11.5–15.5)
WBC: 10.6 10*3/uL — ABNORMAL HIGH (ref 4.0–10.5)

## 2010-11-10 LAB — COMPREHENSIVE METABOLIC PANEL
ALT: 15 U/L (ref 0–53)
CO2: 24 mEq/L (ref 19–32)
Calcium: 8.4 mg/dL (ref 8.4–10.5)
Creatinine, Ser: 2.13 mg/dL — ABNORMAL HIGH (ref 0.4–1.5)
GFR calc Af Amer: 37 mL/min — ABNORMAL LOW (ref >60)
GFR calc non Af Amer: 30 mL/min — ABNORMAL LOW (ref >60)
Glucose, Bld: 92 mg/dL (ref 70–99)
Sodium: 137 mEq/L (ref 135–145)
Total Protein: 5.4 g/dL — ABNORMAL LOW (ref 6.0–8.3)

## 2010-11-10 LAB — CBC
Platelets: 198 10*3/uL (ref 150–400)
RBC: 3.47 MIL/uL — ABNORMAL LOW (ref 4.22–5.81)
RDW: 13 % (ref 11.5–15.5)
WBC: 7.5 10*3/uL (ref 4.0–10.5)

## 2010-11-10 LAB — DIFFERENTIAL
Basophils Absolute: 0 10*3/uL (ref 0.0–0.1)
Basophils Relative: 0 % (ref 0–1)
Eosinophils Absolute: 0.1 10*3/uL (ref 0.0–0.7)
Neutrophils Relative %: 58 % (ref 43–77)

## 2010-11-10 LAB — MAGNESIUM: Magnesium: 1.6 mg/dL (ref 1.5–2.5)

## 2010-11-11 LAB — BASIC METABOLIC PANEL
Calcium: 8.3 mg/dL — ABNORMAL LOW (ref 8.4–10.5)
GFR calc Af Amer: 47 mL/min — ABNORMAL LOW (ref >60)
GFR calc non Af Amer: 39 mL/min — ABNORMAL LOW (ref >60)
Potassium: 3.6 mEq/L (ref 3.5–5.1)
Sodium: 135 mEq/L (ref 135–145)

## 2010-11-12 LAB — BASIC METABOLIC PANEL
CO2: 21 mEq/L (ref 19–32)
Chloride: 109 mEq/L (ref 96–112)
Creatinine, Ser: 1.64 mg/dL — ABNORMAL HIGH (ref 0.4–1.5)
GFR calc Af Amer: 49 mL/min — ABNORMAL LOW (ref >60)
Potassium: 4.1 mEq/L (ref 3.5–5.1)
Sodium: 137 mEq/L (ref 135–145)

## 2010-11-16 LAB — CULTURE, BLOOD (ROUTINE X 2)
Culture  Setup Time: 201204080013
Culture: NO GROWTH

## 2010-11-20 NOTE — H&P (Signed)
[name], [name]                ACCOUNT NO.:  [id number]  MEDICAL RECORD NO.:  [id number]           PATIENT TYPE:  E  LOCATION:  [facility]                         FACILITY:  [facility]  PHYSICIAN:  Kathlen Mody, MD       DATE OF BIRTH:  13-May-1931  DATE OF ADMISSION:  11/09/2010 DATE OF DISCHARGE:                             HISTORY & PHYSICAL   PRIMARY CARE PHYSICIANS:  Dr. Nedra Hai, Gastroenterologist. Dr. Chales Abrahams at [place].  CHIEF COMPLAINT:  Nausea, vomiting, abdominal pain.  HISTORY OF PRESENT ILLNESS:  This is a 77 year old gentleman with a history of abdominal aortic aneurysm, hypertension, hyperlipidemia, peptic ulcer disease, chronic kidney disease, came into ER complaining of nausea, dry heaving, and abdominal pain, has been going on for the last 2 months, worsened over the last 1 week, and generalized abdominal pain more in the epigastric area, pain is constant, worsening on taking food associated with nausea and dry heaving.  History of weight loss more than 30 pounds over the last 2 months.  The patient was recently admitted less than a month ago, October 17, 2010, and was discharged on October 20, 2010 for similar complaints where he was diagnosed with acute pancreatitis.  He also underwent an upper endoscopy where he was found to have antral ulcers, which were H. pylori negative, did not show any dysplasia or malignancy at that time.  The upper endoscopy was done by Dr. Loreta Ave.  The patient denies any chest pain, shortness of breath, palpitations, fever, or cough at this time.  He denies any urinary complaints or diarrhea.  The patient denies any weakness, any tingling or numbness anywhere in the body.  No headache.  No blurry vision.  REVIEW OF SYSTEMS:  See HPI, otherwise negative.  PAST MEDICAL HISTORY: 1. Abdominal aortic aneurysm, the last CAT scan that was done in     March, the size of the aneurysm was 4.9 x 4.3 cm, it was an     infrarenal abdominal aortic aneurysm. 2.  Hyperlipidemia. 3. Peptic ulcer disease. 4. Pancreatitis. 5. Bradycardia. 6. Hypertension. 7. Gastroesophageal reflux disease. 8. Coronary artery disease and MI in 1980 with medical management. 9. Peripheral vascular disease. 10.Chronic kidney disease. 11.Iron deficiency anemia.  PAST SURGICAL HISTORY: 1. Cholecystectomy. 2. Bilateral renal artery stents, placed 4 years ago.  ALLERGIES:  PLAVIX.  FAMILY HISTORY:  Nil significant.  SOCIAL HISTORY:  The patient married, lives at home with his wife. Denies smoking, EtOH, or IV drug abuse.  HOME MEDICATIONS: 1. Colace 100 mg 2 tablets twice a day. 2. Protonix 40 mg twice a day. 3. Folic acid 400 mcg one tablet p.o. daily. 4. Lipitor or 40 mg at bedtime. 5. Hydrochlorothiazide 25 mg p.o. daily. 6. Multivitamin 1 tablet p.o. daily. 7. Lotrel 10/20 one tablet p.o. daily. 8. Multivitamin 1 tablet daily. 9. Niacin 500 mg 4 tablets daily. 10.Vitamin B one tablet daily. 11.Aspirin 81 mg to be taken starting on November 20, 2010, which the     patient has not been on at this time.  PHYSICAL EXAMINATION:  VITALS:  He is afebrile, blood pressure of 115/60, pulse  of 68 per minute, saturating about 96% on room air. GENERAL:  He is alert, afebrile, oriented x3, comfortable, in no acute distress. HEENT:  Pupils equal and reacting to light.  Anicteric.  No JVD.  Dry mucous membranes. CARDIOVASCULAR:  S1 and S2 heard.  No rubs, murmurs, or gallops. RESPIRATORY:  Good air entry bilateral.  No wheezing or rhonchi. ABDOMEN:  Soft, nondistended.  Generalized tenderness over the abdomen, more in the epigastric area.  No signs of peritonitis.  Bowel sounds heard. EXTREMITIES:  No pedal edema. NEUROLOGIC:  Able to move all extremities.  PERTINENT LABS:  The patient had a CBC showed WBC count of 10.6, hemoglobin of 13.4, hematocrit of 40.1, platelets of 279.  Comprehensive metabolic panel shows a sodium of 132, potassium of 3.7, chloride of  99, bicarb 20, glucose 148, BUN 35, creatinine 2.67.  Liver function tests within normal limits.  Lipase of 20.  Urinalysis negative for nitrites and leukocytes.  RADIOLOGY:  Acute abdominal series shows benign appearing abdomen and chest.  ASSESSMENT: 1. Nausea, vomiting, dry heaves, abdominal pain.  Differential     diagnosis could be worsening of the peptic ulcer disease versus     acute pancreatitis versus gastritis. 2. Acute on chronic kidney failure on dehydration. 3. Dehydration. 4. Hyponatremia. 5. Iron deficiency anemia. 6. Hypertension. 7. Hyperlipidemia.  PLAN:  Nausea, dry heaving, abdominal pain.  We will admit the patient to inpatient.  Start the patient on IV fluids normal saline at 100 cc/hr.  The patient on clear liquid diet, IV Protonix 40 mg b.i.d., IV Zofran 4 mg q.6 h.  We will get a CT of abdomen and pelvis without contrast as the patient's creatinine is more than 2, even though the lipase level was normal, will definitely have to rule out acute pancreatitis at this time.  If the abdominal pain does not resolve with pain medications and morphine, we will get a Gastroenterology consult in the morning.  Acute on chronic renal failure, most likely secondary to decreased p.o. intake secondary to dehydration.  We will hydrate the patient adequately, repeat electrolytes and BUN and creatinine in the morning. The patient's baseline hemoglobin is around 9.  His hemoglobin at this time is 13.4.  He appears to be dehydrated.  Iron deficiency anemia.  The patient's baseline hemoglobin is 9.7 on discharge.  His hemoglobin is 13.4, most likely secondary to dehydration.  The patient had an upper endoscopy on October 19, 2010, which showed antral ulcers, which were negative for H. pylori and negative for dysplasia or metaplasia.  The patient had an colonoscopy about 7 years ago, which did not show any polyps.  We will continue to monitor the blood counts.  The patient is  off aspirin and NSAIDs at this time.  Abdominal aortic aneurysm, last CAT scan done in March 2012 showed an infrarenal abdominal aortic aneurysm measuring about 4.9-4.3 cm.  We will again check a CT of abdomen and pelvis and check the size of the aneurysm.  Coronary artery disease, stable.  The patient denies any chest pain at this time.  Hypertension.  His blood pressure appears to be stable at this time.  We will restart his home medications in a.m.  Hyperlipidemia.  We will get a lipid profile.  DVT prophylaxis, subcu Lovenox.  The patient is full code.          ______________________________ Kathlen Mody, MD     VA/MEDQ  D:  11/09/2010  T:  11/09/2010  Job:  [id number]  Electronically Signed by Kathlen Mody MD on 11/20/2010 09:47:32 PM

## 2010-11-23 NOTE — Discharge Summary (Signed)
[name], [name]                ACCOUNT NO.:  [id number]  MEDICAL RECORD NO.:  [id number]           PATIENT TYPE:  I  LOCATION:  6731                         FACILITY:  [facility]  PHYSICIAN:  Kathlen Mody, MD       DATE OF BIRTH:  07/08/1931  DATE OF ADMISSION:  11/09/2010 DATE OF DISCHARGE:  11/12/2010                              DISCHARGE SUMMARY   PRIMARY CARE PHYSICIAN:  Dr. Nedra Hai.  GASTROENTEROLOGIST:  Dr. Chales Abrahams in [place].  DISCHARGE DIAGNOSES: 1. Gastritis/peptic ulcer disease. 2. Acute-on-chronic renal failure secondary to dehydration. 3. Hyponatremia. 4. Iron-deficiency anemia. 5. Hypertension. 6. Hyperlipidemia. 7. Abdominal aortic aneurysm. 8. Coronary artery disease.  DISCHARGE MEDICATIONS: 1. Tylenol 650 mg every 4 hours as needed. 2. Ensure daily. 3. Ferrous sulfate 325 mg twice a day. 4. Maalox every 6 hours as needed. 5. Zofran 4 mg every 6 hours as needed. 6. Colace 100 mg 2 tablets twice daily. 7. Folic acid 1 tablet daily. 8. Lipitor 40 mg every day. 9. Lotrel 1 tablet every day. 10.Multivitamin 1 tablet daily. 11.Niacin 4 tablets every evening. 12.Protonix 40 mg twice daily. 13.Vitamin B complex 1 tablet daily.  PERTINENT LABORATORIES:  The patient had a CBC done on the day of admission significant for a WBC of 10.6.  Comprehensive metabolic panel significant for a sodium of 132, glucose of 148, BUN of 35, creatinine of 2.67, and lipase normal.  Urinalysis was negative for nitrites and leukocyte.  Lipid profile showed triglycerides of 161.  Repeat CBC showed a hemoglobin of 10.1, hematocrit of 31.3.  Blood cultures did not grow anything for 5 days.  On the day of discharge, the patient's BMP is significant for a creatinine of 1.6 at baseline, glucose of 114, sodium of 137.  RADIOLOGY:  The patient had an abdominal x-ray which was normal and a CT abdomen and pelvis without contrast was normal and abdominal __________ size was same as  before.  BRIEF HOSPITAL COURSE:  This is a 77 year old gentleman with history of peptic ulcer disease and recent upper endoscopy done by Dr. Charna Elizabeth on October 19, 2010, showing antral ulcers discharged on Protonix and recommended to follow up with Dr. Chales Abrahams in [place] on November 19, 2010, comes back with similar complaints of nausea, vomiting, and abdominal pain, was not able to keep anything p.o., worsening with eating most likely secondary to gastritis and worsening of the peptic ulcer disease. The patient was kept n.p.o.  Initially, he was started on IV Protonix 40 mg b.i.d., IV Zofran.  He was started on IV fluids.  CT abdomen and pelvis was done without contrast which basically ruled out acute pancreatitis.  Over the course of hospitalization, the patient's nausea and vomiting has improved.  __________  Coronary artery disease, stable.  Hypertension.  His blood pressure was controlled over the course of hospitalization.  PHYSICAL EXAMINATION:  VITAL SIGNS:  Temperature of 98, pulse of 70, respirations 18, blood pressure 103/74, saturating 96% on room air. GENERAL:  He is alert, afebrile, oriented x3, comfortable. CARDIOVASCULAR:  S1, S2 heard. RESPIRATORY:  Good air entry bilaterally. ABDOMEN:  Soft, nontender,  nondistended.  Bowel sounds are heard. EXTREMITIES:  No pedal edema.  The patient hemodynamically stable for discharge.  Follow up with Dr. Nedra Hai in about 1 week and follow up with Dr. Chales Abrahams, gastroenterologist on November 19, 2010, as scheduled.          ______________________________ Kathlen Mody, MD     VA/MEDQ  D:  11/21/2010  T:  11/22/2010  Job:  [id number]  Electronically Signed by Kathlen Mody MD on 11/23/2010 02:19:02 PM

## 2011-05-14 ENCOUNTER — Observation Stay (HOSPITAL_COMMUNITY)
Admission: EM | Admit: 2011-05-14 | Discharge: 2011-05-15 | Disposition: A | Discharge status: Home / Self Care | Payer: Medicare Other | Source: Ambulatory Visit | Attending: Internal Medicine | Admitting: Internal Medicine

## 2011-05-14 ENCOUNTER — Emergency Department (HOSPITAL_COMMUNITY): Payer: Medicare Other

## 2011-05-14 DIAGNOSIS — Z23 Encounter for immunization: Secondary | ICD-10-CM | POA: Insufficient documentation

## 2011-05-14 DIAGNOSIS — I739 Peripheral vascular disease, unspecified: Secondary | ICD-10-CM | POA: Insufficient documentation

## 2011-05-14 DIAGNOSIS — I714 Abdominal aortic aneurysm, without rupture, unspecified: Secondary | ICD-10-CM | POA: Insufficient documentation

## 2011-05-14 DIAGNOSIS — I10 Essential (primary) hypertension: Secondary | ICD-10-CM | POA: Insufficient documentation

## 2011-05-14 DIAGNOSIS — K219 Gastro-esophageal reflux disease without esophagitis: Secondary | ICD-10-CM | POA: Insufficient documentation

## 2011-05-14 DIAGNOSIS — R079 Chest pain, unspecified: Principal | ICD-10-CM | POA: Insufficient documentation

## 2011-05-14 DIAGNOSIS — I2510 Atherosclerotic heart disease of native coronary artery without angina pectoris: Secondary | ICD-10-CM | POA: Insufficient documentation

## 2011-05-14 LAB — CARDIAC PANEL(CRET KIN+CKTOT+MB+TROPI)
CK, MB: 3.3 ng/mL (ref 0.3–4.0)
Relative Index: 2.3 (ref 0.0–2.5)
Total CK: 144 U/L (ref 7–232)
Troponin I: <0.3 ng/mL (ref <0.30)
Troponin I: <0.3 ng/mL (ref <0.30)

## 2011-05-14 LAB — POCT I-STAT TROPONIN I: Troponin i, poc: 0.01 ng/mL (ref 0.00–0.08)

## 2011-05-14 LAB — DIFFERENTIAL
Basophils Absolute: 0 10*3/uL (ref 0.0–0.1)
Eosinophils Relative: 1 % (ref 0–5)
Lymphocytes Relative: 25 % (ref 12–46)
Neutro Abs: 5.8 10*3/uL (ref 1.7–7.7)
Neutrophils Relative %: 65 % (ref 43–77)

## 2011-05-14 LAB — BASIC METABOLIC PANEL
BUN: 31 mg/dL — ABNORMAL HIGH (ref 6–23)
CO2: 25 mEq/L (ref 19–32)
Calcium: 9.1 mg/dL (ref 8.4–10.5)
Calcium: 8.9 mg/dL (ref 8.4–10.5)
Creatinine, Ser: 1.74 mg/dL — ABNORMAL HIGH (ref 0.50–1.35)
GFR calc Af Amer: 41 mL/min — ABNORMAL LOW (ref >90)
GFR calc non Af Amer: 33 mL/min — ABNORMAL LOW (ref >90)
GFR calc non Af Amer: 36 mL/min — ABNORMAL LOW (ref >90)
Potassium: 4.2 mEq/L (ref 3.5–5.1)
Sodium: 139 mEq/L (ref 135–145)

## 2011-05-14 LAB — CK TOTAL AND CKMB (NOT AT ARMC)
CK, MB: 3.5 ng/mL (ref 0.3–4.0)
Relative Index: 2 (ref 0.0–2.5)
Total CK: 173 U/L (ref 7–232)

## 2011-05-14 LAB — CBC
HCT: 35.3 % — ABNORMAL LOW (ref 39.0–52.0)
MCH: 29.8 pg (ref 26.0–34.0)
MCV: 88 fL (ref 78.0–100.0)
Platelets: 183 10*3/uL (ref 150–400)
RBC: 3.93 MIL/uL — ABNORMAL LOW (ref 4.22–5.81)
RDW: 13.7 % (ref 11.5–15.5)
RDW: 13.7 % (ref 11.5–15.5)
WBC: 8.9 10*3/uL (ref 4.0–10.5)

## 2011-05-14 LAB — LIPID PANEL: LDL Cholesterol: 183 mg/dL — ABNORMAL HIGH (ref 0–99)

## 2011-05-14 LAB — PROTIME-INR: INR: 0.87 (ref 0.00–1.49)

## 2011-05-14 NOTE — H&P (Signed)
[name], [name]                ACCOUNT NO.:  [id number]  MEDICAL RECORD NO.:  [id number]  LOCATION:  [facility]                         FACILITY:  [facility]  PHYSICIAN:  Gery Pray, MD      DATE OF BIRTH:  October 26, 1930  DATE OF ADMISSION:  05/14/2011 DATE OF DISCHARGE:                             HISTORY & PHYSICAL   PRIMARY CARE PHYSICIAN:  Dr. Aida Puffer in [place].  NEPHROLOGIST:  Wilber Bihari. Caryn Section, M.D.  CARDIOLOGIST:  Norman Herrlich, MD, [facility] in [employer].  CODE STATUS:  Full code.  The patient goes to team 2.  CHIEF COMPLAINT:  Chest pain.  HISTORY OF PRESENT ILLNESS:  This is a 77 year old gentleman who apparently was at home this evening when he states that he developed chest pain.  The patient gave the ER physician and myself 2 different histories.  Per ER physician the patient states that he had left-sided chest pain that started at approximately 10:00 p.m. this evening. During my interview the patient pointed to left upper quadrant area where he states that he had pain.  The patient states that the pain felt more like a pressure.  It was worse with deep breathing.  There was no radiation, no nausea, no vomiting, no palpitation, no diaphoresis, no lower extremity edema.  The patient states that the pain was present at rest and with activity.  He took 2 aspirin and a nitro which did not help.  He checked his blood pressure, which was quite elevated at 234/115.  He came to the ER.  He does have cardiac history with MI in 1980.  He has not had a heart cath in many years.  He states that he had a stress test a little over year ago which was normal.  In the ER the patient's blood pressure has been steadily improving, currently 113/82.  The patient reports no diaphoresis, no lower extremity edema.  When I revisited the patient to try and clarify where his chest pain was, he now indicates that his chest pain is somewhere between his chest wall and his abdomen.  He  additionally adds that he has a history of peptic ulcer disease, he should be on Prilosec, but he traveled out of state for the past 5 days and he has not been taking his Prilosec.  History obtained from the patient.  PAST MEDICAL HISTORY:  Hypertension, abdominal aortic aneurysm, dyslipidemia, peptic ulcer disease, peripheral vascular disease, morbid obesity, chronic kidney disease, history of pancreatitis, and GERD.  PAST SURGICAL HISTORY:  Cholecystectomy and renal artery stenosis bilateral stent.  MEDICATIONS:  Aspirin, atenolol, folic acid, hydrochlorothiazide, Lipitor, Lotrel, multivitamin tablets, niacin.  ALLERGIES:  The patient is allergic to PLAVIX.  SOCIAL HISTORY:  Negative for tobacco, alcohol, illicit drugs.  Lives with his family.  No home oxygen.  He does not use a walker or cane.  FAMILY HISTORY:  Negative for hypertension.  REVIEW OF SYSTEMS:  As noted in the HPI.  All 10-point system reviewed and otherwise negative.  PHYSICAL EXAMINATION:  VITAL SIGNS:  Blood pressure 160/70, pulse 57, respirations 20, temperature 97.9.  Sat 96% on room air. GENERAL:  Alert, oriented male, currently in no  acute distress. EYES:  Pink conjunctivae.  PERRLA. ENT:  Moist oral mucosa.  Trachea midline.  Positive dental caries. NECK:  Supple. LUNGS:  Clear to auscultation bilaterally.  No wheeze appreciated.  No use of accessory muscles. CARDIOVASCULAR:  Regular rate and rhythm without murmurs, regurg, or gallops.  No JVD. ABDOMEN:  Morbidly obese, soft, positive bowel sounds.  No reproducible abdominal pain.  No reproducible chest wall pain.  No guarding or rebound.  Unable to assess organomegaly because of the patient's body habitus. NEURO:  Cranial nerves II-XII grossly intact.  Sensation intact. MUSCULOSKELETAL:  Strength 5/5 in all extremities.  No clubbing, cyanosis, or edema.  LABORATORY DATA:  Sodium 139, potassium 4.2, chloride 105, CO2 25, glucose 167, BUN 35,  creatinine 1.84.  INR 0.87.  Chest x-ray:  Mild right lower lobe opacity __ atelectasis.  White blood count 8.9, hemoglobin 12.1, platelets 199.  Troponin 0.01.  EKG shows atrial fibrillation rate 150.  ASSESSMENT AND PLAN: 1. Chest pain.  Unclear if this is gastrointestinal versus cardiac.     We will go ahead and resume the patient's Protonix.  We will cycle     the patient's cardiac enzyme.  Resume the patient's home     medications and monitor.  Stress test has not been ordered as I am     currently doubtful if the patient is having angina.  Defer     to a.m. team.  The patient also does have a history of     pancreatitis, therefore I will go ahead and order lipase and     amylase.  Monitor the patient on tele.  Fasting lipid panel has     been ordered. 2. Hypertension.  As stated resume home medications.  P.r.n. blood     pressure medications will also be ordered. 3. Coronary artery disease. 4. Abdominal aortic aneurysm. 5. Peripheral vascular disease. 6. History of renal artery stenosis. 7. Chronic kidney disease, all stable.  Resume home medications.          ______________________________ Gery Pray, MD     DC/MEDQ  D:  05/14/2011  T:  05/14/2011  Job:  [id number]  Electronically Signed by Gery Pray MD on 05/14/2011 07:01:18 AM

## 2011-05-15 ENCOUNTER — Observation Stay (HOSPITAL_COMMUNITY): Payer: Medicare Other

## 2011-05-15 MED ORDER — TECHNETIUM TO 99M ALBUMIN AGGREGATED
6.0000 | Freq: Once | INTRAVENOUS | Status: AC | PRN
  Administered 2011-05-15: 6 via INTRAVENOUS

## 2011-05-15 MED ORDER — XENON XE 133 GAS
10.0000 | GAS_FOR_INHALATION | Freq: Once | RESPIRATORY_TRACT | Status: AC | PRN
  Administered 2011-05-15: 10 via RESPIRATORY_TRACT

## 2011-05-15 NOTE — H&P (Signed)
  NAMESHADOE, BETHEL                ACCOUNT NO.:  [id number]  MEDICAL RECORD NO.:  [id number]  LOCATION:  3712                         FACILITY:  [facility]  PHYSICIAN:  Gery Pray, MD      DATE OF BIRTH:  [birth year]  DATE OF ADMISSION:  05/14/2011 DATE OF DISCHARGE:                             HISTORY & PHYSICAL   ADDENDUM/CORRECTION REPORT  The patient's EKG showed normal sinus rhythm.          ______________________________ Gery Pray, MD     DC/MEDQ  D:  05/14/2011  T:  05/14/2011  Job:  [id number]  Electronically Signed by Gery Pray MD on 05/15/2011 11:32:11 PM

## 2011-05-27 NOTE — Discharge Summary (Signed)
NAMEHENOK, HEACOCK                ACCOUNT NO.:  [id number]  MEDICAL RECORD NO.:  [id number]  LOCATION:  3712                         FACILITY:  [facility]  PHYSICIAN:  Osvaldo Shipper, MD     DATE OF BIRTH:  Mar 24, 1931  DATE OF ADMISSION:  05/14/2011 DATE OF DISCHARGE:  05/15/2011                              DISCHARGE SUMMARY   PRIMARY CARE PHYSICIAN:  Aida Puffer, MD in [place].  NEPHROLOGIST:  Wilber Bihari. Caryn Section, MD  CARDIOLOGIST:  Norman Herrlich, MD in [place].  Imaging studies done during this hospitalization include the following: 1. Chest x-ray on May 14, 2011, which showed a mild right lower     lobe opacity likely scarring or atelectasis. 2. Chest x-ray repeated today showed no acute abnormalities and     previously seen changes resolved. 3. V/Q scan showed low probability for acute pulmonary embolus.  DISCHARGE DIAGNOSES: 1. Chest pain probably related to gastroesophageal reflux disease and     peptic ulcer disease, resolved. 2. History of coronary artery disease, stable. 3. History of hypertension, stable. 4. History of abdominal aortic aneurysm, stable. 5. History of peripheral vascular disease, stable.  BRIEF HOSPITAL COURSE:  Briefly, this is a 77 year old Caucasian male with a history of CAD who presented with left-sided chest versus left upper abdominal pain.  He said he ate some pizza the previous night, which may have precipitated his symptoms.  However, because of his significant heart disease, he decided to come into the hospital.  We evaluated him, he ruled out for acute coronary syndrome by serial cardiac enzymes.  He did mention some pleuritic nature of the pain and so we went ahead and checked a D-dimer, which was elevated at 1.13.  We went ahead with a V/Q scan, which was low probability for PE.  In the meantime, his chest pain is completely resolved.  So at this time, I feel that indeed it may have been GI in origin.  I have asked him to follow up with  his PCP and with his cardiologist as needed.  If he has recurrence of chest pain, he can return to the hospital.  In the meantime, we checked his lipid panel as well.  His total cholesterol was 277, LDL is 183, his HDL is 35.  He is already on Lipitor.  He may need to follow up with his PCP to decide whether he needs a change in his dose of the statin.  His chronic kidney disease is also stable.  On the day of discharge, the patient is feeling well.  Denies any pain or shortness of breath.  No nausea or vomiting.  He has been ambulating with no difficulties.  His vital signs are all stable.  Lungs are clear to auscultation bilaterally.  No wheezing, rales, or rhonchi. Cardiovascular, S1 and S2 is normal, regular.  Abdomen is soft. Neurologically, he is alert and oriented x3.  No focal neurological deficits are present.  So essentially the patient is stable for discharge.  DISCHARGE MEDICATIONS: 1. Aspirin 81 mg daily. 2. Atenolol 50 mg daily. 3. Tylenol 325 mg 2 tablets every 4 hours as needed for headache,     pain, or fever.  4. Colace 100 mg 2 tablets twice daily as needed for constipation. 5. Folic acid 400 mg over-the-counter 1 tablet every morning. 6. Lipitor 40 mg every evening. 7. Lotrel 10/20 one capsule every morning. 8. Multivitamins 1 tablet every morning. 9. Niacin 500 mg 4 tablets every evening. 10.Prilosec 20 mg p.o. daily. 11.Vitamin B complex over-the-counter 1 tablet daily every evening.  Follow up with his PCP in 1-2 weeks and with cardiologist as needed.  Please also note that the EKG that was done did not show any acute ischemic changes.  DIET:  Heart healthy.  PHYSICAL ACTIVITY:  As tolerated.  Total time on this discharge encounter 35 minutes.  Osvaldo Shipper, MD     GK/MEDQ  D:  05/15/2011  T:  05/15/2011  Job:  [id number]  cc:   Aida Puffer, MD Norman Herrlich, MD  Electronically Signed by Osvaldo Shipper MD on 05/27/2011 07:11:38 PM

## 2011-11-06 ENCOUNTER — Other Ambulatory Visit: Payer: Self-pay | Admitting: Nephrology

## 2011-11-06 DIAGNOSIS — N2889 Other specified disorders of kidney and ureter: Secondary | ICD-10-CM

## 2011-11-11 ENCOUNTER — Ambulatory Visit
Admission: RE | Admit: 2011-11-11 | Discharge: 2011-11-11 | Disposition: A | Discharge status: Home / Self Care | Payer: Medicare Other | Source: Ambulatory Visit | Attending: Nephrology | Admitting: Nephrology

## 2011-11-11 DIAGNOSIS — N2889 Other specified disorders of kidney and ureter: Secondary | ICD-10-CM

## 2012-06-07 ENCOUNTER — Other Ambulatory Visit: Payer: Self-pay | Admitting: Orthopedic Surgery

## 2012-06-08 ENCOUNTER — Encounter (HOSPITAL_COMMUNITY): Payer: Self-pay | Admitting: Pharmacy Technician

## 2012-06-11 ENCOUNTER — Encounter (HOSPITAL_COMMUNITY): Payer: Self-pay

## 2012-06-11 ENCOUNTER — Encounter (HOSPITAL_COMMUNITY)
Admission: RE | Admit: 2012-06-11 | Discharge: 2012-06-11 | Disposition: A | Discharge status: Home / Self Care | Payer: Medicare Other | Source: Ambulatory Visit | Attending: Orthopedic Surgery | Admitting: Orthopedic Surgery

## 2012-06-11 HISTORY — DX: Essential (primary) hypertension: I10

## 2012-06-11 HISTORY — DX: Gastro-esophageal reflux disease without esophagitis: K21.9

## 2012-06-11 HISTORY — DX: Unspecified osteoarthritis, unspecified site: M19.90

## 2012-06-11 HISTORY — DX: Atherosclerotic heart disease of native coronary artery without angina pectoris: I25.10

## 2012-06-11 HISTORY — DX: Chronic kidney disease, unspecified: N18.9

## 2012-06-11 HISTORY — DX: Acute myocardial infarction, unspecified: I21.9

## 2012-06-11 HISTORY — DX: Hyperlipidemia, unspecified: E78.5

## 2012-06-11 LAB — CBC WITH DIFFERENTIAL/PLATELET
Basophils Relative: 0 % (ref 0–1)
Eosinophils Relative: 0 % (ref 0–5)
HCT: 35.6 % — ABNORMAL LOW (ref 39.0–52.0)
Hemoglobin: 12.2 g/dL — ABNORMAL LOW (ref 13.0–17.0)
Lymphocytes Relative: 25 % (ref 12–46)
MCHC: 34.3 g/dL (ref 30.0–36.0)
Monocytes Relative: 8 % (ref 3–12)
Neutro Abs: 10 10*3/uL — ABNORMAL HIGH (ref 1.7–7.7)
Neutrophils Relative %: 67 % (ref 43–77)
RBC: 3.87 MIL/uL — ABNORMAL LOW (ref 4.22–5.81)
WBC: 14.9 10*3/uL — ABNORMAL HIGH (ref 4.0–10.5)

## 2012-06-11 LAB — COMPREHENSIVE METABOLIC PANEL
ALT: 15 U/L (ref 0–53)
Alkaline Phosphatase: 117 U/L (ref 39–117)
BUN: 41 mg/dL — ABNORMAL HIGH (ref 6–23)
CO2: 28 mEq/L (ref 19–32)
Chloride: 98 mEq/L (ref 96–112)
GFR calc Af Amer: 28 mL/min — ABNORMAL LOW (ref >90)
Glucose, Bld: 241 mg/dL — ABNORMAL HIGH (ref 70–99)
Potassium: 3.2 mEq/L — ABNORMAL LOW (ref 3.5–5.1)
Sodium: 140 mEq/L (ref 135–145)
Total Bilirubin: 0.7 mg/dL (ref 0.3–1.2)
Total Protein: 6.8 g/dL (ref 6.0–8.3)

## 2012-06-11 LAB — SURGICAL PCR SCREEN: Staphylococcus aureus: NEGATIVE

## 2012-06-11 LAB — URINALYSIS, ROUTINE W REFLEX MICROSCOPIC
Bilirubin Urine: NEGATIVE
Glucose, UA: NEGATIVE mg/dL
Hgb urine dipstick: NEGATIVE
Protein, ur: NEGATIVE mg/dL
Urobilinogen, UA: 1 mg/dL (ref 0.0–1.0)

## 2012-06-11 LAB — TYPE AND SCREEN: ABO/RH(D): O POS

## 2012-06-11 LAB — URINE MICROSCOPIC-ADD ON

## 2012-06-11 LAB — APTT: aPTT: 32 seconds (ref 24–37)

## 2012-06-11 LAB — PROTIME-INR: Prothrombin Time: 12.7 seconds (ref 11.6–15.2)

## 2012-06-11 NOTE — Pre-Procedure Instructions (Signed)
20 [name]  06/11/2012   Your procedure is scheduled on:  Monday June 21, 2012  Report to [facility] at 8:00 AM.  Call this number if you have problems the morning of surgery: [phone]   Remember:   Do not eat food or drink :After Midnight.      Take these medicines the morning of surgery with A SIP OF WATER: allopurinol, atenolol, isosorbide, omeprazole   Do not wear jewelry, make-up or nail polish.  Do not wear lotions, powders, or perfumes.  Do not shave 48 hours prior to surgery. Men may shave face and neck.  Do not bring valuables to the hospital.  Contacts, dentures or bridgework may not be worn into surgery.  Leave suitcase in the car. After surgery it may be brought to your room.  For patients admitted to the hospital, checkout time is 11:00 AM the day of discharge.   Patients discharged the day of surgery will not be allowed to drive home.  Name and phone number of your driver: family / friend  Special Instructions: Shower using CHG 2 nights before surgery and the night before surgery.  If you shower the day of surgery use CHG.  Use special wash - you have one bottle of CHG for all showers.  You should use approximately 1/3 of the bottle for each shower.   Please read over the following fact sheets that you were given: Pain Booklet, Coughing and Deep Breathing, Blood Transfusion Information, MRSA Information and Surgical Site Infection Prevention

## 2012-06-11 NOTE — Progress Notes (Addendum)
Primary Physician - Dr. Lesly Rubenstein [facility] Cardiologist - Dr. Dulce Sellar, [facility]  Stress test and EKG 10/13 will request records - dr. Dulce Sellar Also had 2 view chest 10/13 will request records - dr. Dulce Sellar

## 2012-06-16 ENCOUNTER — Encounter (HOSPITAL_COMMUNITY): Payer: Self-pay | Admitting: Vascular Surgery

## 2012-06-16 NOTE — Consult Note (Signed)
Anesthesia Chart Review:  Patient is a 78 year old male scheduled for left TKA by Dr. Sherlean Foot on 06/21/12.  History includes CAD/inferior MI, HLD, HTN, obesity, GERD, CKD stage III, AAA (4.3 cm by ultrasound 05/2012 by cardiology notes), non-smoker.  PCP is Dr. Aida Puffer @ [facility] who medically cleared patient for this procedure.  Nephrologist is Dr. Caryn Section.    Cardiologist is Dr. Dulce Sellar at [facility]) in [place].  Patient was seen for a preoperative evaluation on 05/17/2012 and a stress test was recommended (see below).  EKG on 01/02/12 [facility]) showed SB, first degree AVB, occasional PVC, right BBB, inferior infarct (age undetermined).  Nuclear stress test on 05/19/12 [facility]) showed no evidence of ischemia, fixed defect noted in the inferior wall of the LV suggesting scar from previous MI, diminished EF calculated at 37% (visually 40%) with inferior akinesis.  Echo on 01/15/12 [facility]) showed mild concentric LVH, moderate LA enlargement, mild LV systolic dysfunction, EF 40-45%, aortic valve sclerosis with trivial regurgitation, mild aneurysmal dilation of the ascending aorta (aortic root 28 mm).  There was no CXR at Dr. Fredirick Maudlin or Dr. Hulen Shouts office, so he will need one on arrival.  Labs noted. Cr 2.36 (was 1.76 - 2.3 since 01/02/12 according to comparison labs received from Dr. Clarene Duke and Dr. Dulce Sellar).  Non-fasting glucose is 241.  He does not have a known history of DM.  His fasting glucose at Dr. Fredirick Maudlin office on 05/18/12 was 98.  (Patient reported that he had just finished eating breakfast at a restaurant prior to his PAT appointment.  He is on daily Lasix, but denies any new polyuria or polydipsia.)  WBC is also elevated at 14.9.  (I called these results to Keri at Dr. Tobin Chad office and faxed results to Dr. Clarene Duke for his review.)  I'll order an ISTAT 8 for arrival to re-evaluate his Cr and glucose.  Shonna Chock, PA-C

## 2012-06-20 MED ORDER — CEFAZOLIN SODIUM 10 G IJ SOLR
3.0000 g | INTRAMUSCULAR | Status: AC
  Administered 2012-06-21: 3 g via INTRAVENOUS
  Filled 2012-06-20: qty 3000

## 2012-06-20 MED ORDER — ACETAMINOPHEN 10 MG/ML IV SOLN
1000.0000 mg | Freq: Four times a day (QID) | INTRAVENOUS | Status: DC
  Administered 2012-06-21: 1000 mg via INTRAVENOUS
  Filled 2012-06-20 (×3): qty 100

## 2012-06-20 MED ORDER — CHLORHEXIDINE GLUCONATE 4 % EX LIQD: 60.0000 mL | Freq: Once | CUTANEOUS | Status: DC

## 2012-06-21 ENCOUNTER — Inpatient Hospital Stay (HOSPITAL_COMMUNITY): Payer: Medicare Other

## 2012-06-21 ENCOUNTER — Encounter (HOSPITAL_COMMUNITY)
Admission: RE | Disposition: A | Discharge status: Home Health | Payer: Self-pay | Source: Ambulatory Visit | Attending: Orthopedic Surgery

## 2012-06-21 ENCOUNTER — Inpatient Hospital Stay (HOSPITAL_COMMUNITY)
Admission: RE | Admit: 2012-06-21 | Discharge: 2012-06-24 | DRG: 470 | Disposition: A | Discharge status: Home Health | Payer: Medicare Other | Source: Ambulatory Visit | Attending: Orthopedic Surgery | Admitting: Orthopedic Surgery

## 2012-06-21 ENCOUNTER — Inpatient Hospital Stay (HOSPITAL_COMMUNITY): Payer: Medicare Other | Admitting: Vascular Surgery

## 2012-06-21 ENCOUNTER — Encounter (HOSPITAL_COMMUNITY): Payer: Self-pay | Admitting: Vascular Surgery

## 2012-06-21 DIAGNOSIS — M1712 Unilateral primary osteoarthritis, left knee: Secondary | ICD-10-CM

## 2012-06-21 DIAGNOSIS — Z01812 Encounter for preprocedural laboratory examination: Secondary | ICD-10-CM

## 2012-06-21 DIAGNOSIS — M1710 Unilateral primary osteoarthritis, unspecified knee: Principal | ICD-10-CM | POA: Diagnosis present

## 2012-06-21 DIAGNOSIS — I2510 Atherosclerotic heart disease of native coronary artery without angina pectoris: Secondary | ICD-10-CM | POA: Diagnosis present

## 2012-06-21 DIAGNOSIS — N189 Chronic kidney disease, unspecified: Secondary | ICD-10-CM | POA: Diagnosis present

## 2012-06-21 DIAGNOSIS — I129 Hypertensive chronic kidney disease with stage 1 through stage 4 chronic kidney disease, or unspecified chronic kidney disease: Secondary | ICD-10-CM | POA: Diagnosis present

## 2012-06-21 DIAGNOSIS — I714 Abdominal aortic aneurysm, without rupture, unspecified: Secondary | ICD-10-CM | POA: Diagnosis present

## 2012-06-21 DIAGNOSIS — K219 Gastro-esophageal reflux disease without esophagitis: Secondary | ICD-10-CM | POA: Diagnosis present

## 2012-06-21 DIAGNOSIS — D62 Acute posthemorrhagic anemia: Secondary | ICD-10-CM | POA: Diagnosis not present

## 2012-06-21 DIAGNOSIS — E785 Hyperlipidemia, unspecified: Secondary | ICD-10-CM | POA: Diagnosis present

## 2012-06-21 DIAGNOSIS — I252 Old myocardial infarction: Secondary | ICD-10-CM

## 2012-06-21 DIAGNOSIS — S68118A Complete traumatic metacarpophalangeal amputation of other finger, initial encounter: Secondary | ICD-10-CM

## 2012-06-21 HISTORY — PX: TOTAL KNEE ARTHROPLASTY: SHX125

## 2012-06-21 LAB — POCT I-STAT, CHEM 8
Calcium, Ion: 1.06 mmol/L — ABNORMAL LOW (ref 1.13–1.30)
Chloride: 100 mEq/L (ref 96–112)
Glucose, Bld: 149 mg/dL — ABNORMAL HIGH (ref 70–99)
HCT: 37 % — ABNORMAL LOW (ref 39.0–52.0)
Hemoglobin: 12.6 g/dL — ABNORMAL LOW (ref 13.0–17.0)
TCO2: 27 mmol/L (ref 0–100)

## 2012-06-21 SURGERY — ARTHROPLASTY, KNEE, TOTAL
Anesthesia: General | Site: Knee | Laterality: Left | Wound class: Clean

## 2012-06-21 MED ORDER — ACETAMINOPHEN 10 MG/ML IV SOLN
1000.0000 mg | Freq: Four times a day (QID) | INTRAVENOUS | Status: AC
  Administered 2012-06-21 – 2012-06-22 (×4): 1000 mg via INTRAVENOUS
  Filled 2012-06-21 (×4): qty 100

## 2012-06-21 MED ORDER — SENNA 8.6 MG PO TABS
1.0000 | ORAL_TABLET | Freq: Two times a day (BID) | ORAL | Status: DC
  Administered 2012-06-21 – 2012-06-23 (×5): 8.6 mg via ORAL
  Filled 2012-06-21 (×8): qty 1

## 2012-06-21 MED ORDER — OXYCODONE HCL 5 MG/5ML PO SOLN: 5.0000 mg | Freq: Once | ORAL | Status: DC | PRN

## 2012-06-21 MED ORDER — HYDROMORPHONE HCL PF 1 MG/ML IJ SOLN
1.0000 mg | INTRAMUSCULAR | Status: DC | PRN
  Administered 2012-06-21: 1 mg via INTRAVENOUS
  Filled 2012-06-21: qty 1

## 2012-06-21 MED ORDER — BUPIVACAINE 0.25 % ON-Q PUMP SINGLE CATH 300ML
INJECTION | Status: DC | PRN
  Administered 2012-06-21: 300 mL

## 2012-06-21 MED ORDER — ONDANSETRON HCL 4 MG PO TABS: 4.0000 mg | ORAL_TABLET | Freq: Four times a day (QID) | ORAL | Status: DC | PRN

## 2012-06-21 MED ORDER — ACETAMINOPHEN 325 MG PO TABS
650.0000 mg | ORAL_TABLET | Freq: Four times a day (QID) | ORAL | Status: DC | PRN
  Filled 2012-06-21: qty 2

## 2012-06-21 MED ORDER — ACETAMINOPHEN 10 MG/ML IV SOLN
INTRAVENOUS | Status: AC
  Filled 2012-06-21: qty 100

## 2012-06-21 MED ORDER — HYDROMORPHONE HCL PF 1 MG/ML IJ SOLN
INTRAMUSCULAR | Status: AC
  Filled 2012-06-21: qty 1

## 2012-06-21 MED ORDER — METHOCARBAMOL 100 MG/ML IJ SOLN
500.0000 mg | Freq: Four times a day (QID) | INTRAVENOUS | Status: DC | PRN
  Filled 2012-06-21: qty 5

## 2012-06-21 MED ORDER — DOCUSATE SODIUM 100 MG PO CAPS
100.0000 mg | ORAL_CAPSULE | Freq: Two times a day (BID) | ORAL | Status: DC
  Administered 2012-06-21 – 2012-06-23 (×5): 100 mg via ORAL
  Filled 2012-06-21 (×7): qty 1

## 2012-06-21 MED ORDER — ATORVASTATIN CALCIUM 80 MG PO TABS
80.0000 mg | ORAL_TABLET | Freq: Every evening | ORAL | Status: DC
  Administered 2012-06-21 – 2012-06-23 (×2): 80 mg via ORAL
  Filled 2012-06-21 (×4): qty 1

## 2012-06-21 MED ORDER — SODIUM CHLORIDE 0.9 % IR SOLN
Status: DC | PRN
  Administered 2012-06-21: 3000 mL

## 2012-06-21 MED ORDER — ALUM & MAG HYDROXIDE-SIMETH 200-200-20 MG/5ML PO SUSP
30.0000 mL | ORAL | Status: DC | PRN
  Administered 2012-06-24: 30 mL via ORAL
  Filled 2012-06-21: qty 30

## 2012-06-21 MED ORDER — LIDOCAINE HCL (CARDIAC) 20 MG/ML IV SOLN
INTRAVENOUS | Status: DC | PRN
  Administered 2012-06-21: 100 mg via INTRAVENOUS

## 2012-06-21 MED ORDER — HYDROMORPHONE HCL PF 1 MG/ML IJ SOLN
0.2500 mg | INTRAMUSCULAR | Status: DC | PRN
  Administered 2012-06-21 (×2): 0.5 mg via INTRAVENOUS

## 2012-06-21 MED ORDER — CEFAZOLIN SODIUM-DEXTROSE 2-3 GM-% IV SOLR
2.0000 g | Freq: Four times a day (QID) | INTRAVENOUS | Status: AC
  Administered 2012-06-21 – 2012-06-22 (×2): 2 g via INTRAVENOUS
  Filled 2012-06-21 (×3): qty 50

## 2012-06-21 MED ORDER — EZETIMIBE 10 MG PO TABS
10.0000 mg | ORAL_TABLET | Freq: Every day | ORAL | Status: DC
  Administered 2012-06-21 – 2012-06-23 (×3): 10 mg via ORAL
  Filled 2012-06-21 (×4): qty 1

## 2012-06-21 MED ORDER — SENNOSIDES-DOCUSATE SODIUM 8.6-50 MG PO TABS: 1.0000 | ORAL_TABLET | Freq: Every evening | ORAL | Status: DC | PRN

## 2012-06-21 MED ORDER — PANTOPRAZOLE SODIUM 40 MG PO TBEC
40.0000 mg | DELAYED_RELEASE_TABLET | Freq: Every day | ORAL | Status: DC
  Administered 2012-06-22 – 2012-06-23 (×2): 40 mg via ORAL
  Filled 2012-06-21 (×2): qty 1

## 2012-06-21 MED ORDER — HYDROCHLOROTHIAZIDE 25 MG PO TABS
25.0000 mg | ORAL_TABLET | Freq: Every day | ORAL | Status: DC
  Administered 2012-06-21 – 2012-06-22 (×2): 25 mg via ORAL
  Filled 2012-06-21 (×4): qty 1

## 2012-06-21 MED ORDER — CELECOXIB 200 MG PO CAPS
200.0000 mg | ORAL_CAPSULE | Freq: Two times a day (BID) | ORAL | Status: DC
  Administered 2012-06-21 – 2012-06-23 (×5): 200 mg via ORAL
  Filled 2012-06-21 (×7): qty 1

## 2012-06-21 MED ORDER — METOCLOPRAMIDE HCL 5 MG/ML IJ SOLN: 5.0000 mg | Freq: Three times a day (TID) | INTRAMUSCULAR | Status: DC | PRN

## 2012-06-21 MED ORDER — ATENOLOL 100 MG PO TABS
100.0000 mg | ORAL_TABLET | Freq: Every day | ORAL | Status: DC
  Administered 2012-06-22 – 2012-06-23 (×2): 100 mg via ORAL
  Filled 2012-06-21 (×3): qty 1

## 2012-06-21 MED ORDER — ZOLPIDEM TARTRATE 5 MG PO TABS: 5.0000 mg | ORAL_TABLET | Freq: Every evening | ORAL | Status: DC | PRN

## 2012-06-21 MED ORDER — SODIUM CHLORIDE 0.9 % IV SOLN: INTRAVENOUS | Status: DC

## 2012-06-21 MED ORDER — BISACODYL 5 MG PO TBEC: 5.0000 mg | DELAYED_RELEASE_TABLET | Freq: Every day | ORAL | Status: DC | PRN

## 2012-06-21 MED ORDER — PHENOL 1.4 % MT LIQD: 1.0000 | OROMUCOSAL | Status: DC | PRN

## 2012-06-21 MED ORDER — ISOSORB DINITRATE-HYDRALAZINE 20-37.5 MG PO TABS
1.0000 | ORAL_TABLET | Freq: Every day | ORAL | Status: DC
  Administered 2012-06-21 – 2012-06-23 (×3): 1 via ORAL
  Filled 2012-06-21 (×4): qty 1

## 2012-06-21 MED ORDER — LACTATED RINGERS IV SOLN
INTRAVENOUS | Status: DC | PRN
  Administered 2012-06-21 (×2): via INTRAVENOUS

## 2012-06-21 MED ORDER — OXYCODONE HCL 5 MG PO TABS: 5.0000 mg | ORAL_TABLET | Freq: Once | ORAL | Status: DC | PRN

## 2012-06-21 MED ORDER — ONDANSETRON HCL 4 MG/2ML IJ SOLN: 4.0000 mg | Freq: Four times a day (QID) | INTRAMUSCULAR | Status: DC | PRN

## 2012-06-21 MED ORDER — OXYCODONE HCL 5 MG PO TABS
5.0000 mg | ORAL_TABLET | ORAL | Status: DC | PRN
  Administered 2012-06-21 – 2012-06-23 (×8): 10 mg via ORAL
  Filled 2012-06-21 (×7): qty 2

## 2012-06-21 MED ORDER — PHENYLEPHRINE HCL 10 MG/ML IJ SOLN
INTRAMUSCULAR | Status: DC | PRN
  Administered 2012-06-21: 120 ug via INTRAVENOUS

## 2012-06-21 MED ORDER — ONDANSETRON HCL 4 MG/2ML IJ SOLN: 4.0000 mg | Freq: Once | INTRAMUSCULAR | Status: DC | PRN

## 2012-06-21 MED ORDER — FENTANYL CITRATE 0.05 MG/ML IJ SOLN
INTRAMUSCULAR | Status: DC | PRN
  Administered 2012-06-21 (×4): 50 ug via INTRAVENOUS

## 2012-06-21 MED ORDER — SODIUM CHLORIDE 0.9 % IV SOLN
INTRAVENOUS | Status: DC
  Administered 2012-06-21 – 2012-06-22 (×2): via INTRAVENOUS

## 2012-06-21 MED ORDER — PROPOFOL 10 MG/ML IV BOLUS
INTRAVENOUS | Status: DC | PRN
  Administered 2012-06-21: 130 mg via INTRAVENOUS

## 2012-06-21 MED ORDER — ACETAMINOPHEN 650 MG RE SUPP: 650.0000 mg | Freq: Four times a day (QID) | RECTAL | Status: DC | PRN

## 2012-06-21 MED ORDER — BUPIVACAINE 0.25 % ON-Q PUMP SINGLE CATH 300ML
300.0000 mL | INJECTION | Status: DC
  Filled 2012-06-21: qty 300

## 2012-06-21 MED ORDER — DIPHENHYDRAMINE HCL 12.5 MG/5ML PO ELIX: 12.5000 mg | ORAL_SOLUTION | ORAL | Status: DC | PRN

## 2012-06-21 MED ORDER — ALLOPURINOL 300 MG PO TABS
300.0000 mg | ORAL_TABLET | Freq: Every day | ORAL | Status: DC
  Administered 2012-06-21 – 2012-06-23 (×3): 300 mg via ORAL
  Filled 2012-06-21 (×4): qty 1

## 2012-06-21 MED ORDER — METHOCARBAMOL 100 MG/ML IJ SOLN
500.0000 mg | INTRAVENOUS | Status: AC
  Administered 2012-06-21: 500 mg via INTRAVENOUS
  Filled 2012-06-21: qty 5

## 2012-06-21 MED ORDER — MIDAZOLAM HCL 5 MG/5ML IJ SOLN
INTRAMUSCULAR | Status: DC | PRN
  Administered 2012-06-21: 2 mg via INTRAVENOUS

## 2012-06-21 MED ORDER — ENOXAPARIN SODIUM 30 MG/0.3ML ~~LOC~~ SOLN
30.0000 mg | Freq: Two times a day (BID) | SUBCUTANEOUS | Status: DC
  Administered 2012-06-22 – 2012-06-24 (×5): 30 mg via SUBCUTANEOUS
  Filled 2012-06-21 (×7): qty 0.3

## 2012-06-21 MED ORDER — MENTHOL 3 MG MT LOZG: 1.0000 | LOZENGE | OROMUCOSAL | Status: DC | PRN

## 2012-06-21 MED ORDER — BUPIVACAINE-EPINEPHRINE PF 0.25-1:200000 % IJ SOLN
INTRAMUSCULAR | Status: AC
  Filled 2012-06-21: qty 30

## 2012-06-21 MED ORDER — MEPERIDINE HCL 25 MG/ML IJ SOLN: 6.2500 mg | INTRAMUSCULAR | Status: DC | PRN

## 2012-06-21 MED ORDER — OXYCODONE HCL ER 10 MG PO T12A
10.0000 mg | EXTENDED_RELEASE_TABLET | Freq: Two times a day (BID) | ORAL | Status: DC
  Administered 2012-06-21 – 2012-06-23 (×5): 10 mg via ORAL
  Filled 2012-06-21 (×6): qty 1

## 2012-06-21 MED ORDER — FUROSEMIDE 20 MG PO TABS
20.0000 mg | ORAL_TABLET | Freq: Every day | ORAL | Status: DC
  Administered 2012-06-21 – 2012-06-23 (×3): 20 mg via ORAL
  Filled 2012-06-21 (×4): qty 1

## 2012-06-21 MED ORDER — ONDANSETRON HCL 4 MG/2ML IJ SOLN
INTRAMUSCULAR | Status: DC | PRN
  Administered 2012-06-21: 4 mg via INTRAVENOUS

## 2012-06-21 MED ORDER — BUPIVACAINE-EPINEPHRINE 0.25% -1:200000 IJ SOLN
INTRAMUSCULAR | Status: DC | PRN
  Administered 2012-06-21: 30 mL

## 2012-06-21 MED ORDER — METOCLOPRAMIDE HCL 5 MG PO TABS
5.0000 mg | ORAL_TABLET | Freq: Three times a day (TID) | ORAL | Status: DC | PRN
  Filled 2012-06-21: qty 2

## 2012-06-21 MED ORDER — EPHEDRINE SULFATE 50 MG/ML IJ SOLN
INTRAMUSCULAR | Status: DC | PRN
  Administered 2012-06-21 (×3): 10 mg via INTRAVENOUS

## 2012-06-21 MED ORDER — METHOCARBAMOL 500 MG PO TABS
500.0000 mg | ORAL_TABLET | Freq: Four times a day (QID) | ORAL | Status: DC | PRN
  Administered 2012-06-22 (×2): 500 mg via ORAL
  Filled 2012-06-21 (×2): qty 1

## 2012-06-21 MED ORDER — BUPIVACAINE ON-Q PAIN PUMP (FOR ORDER SET NO CHG)
INJECTION | Status: DC
  Filled 2012-06-21: qty 1

## 2012-06-21 SURGICAL SUPPLY — 60 items
BANDAGE ESMARK 6X9 LF (GAUZE/BANDAGES/DRESSINGS) ×1 IMPLANT
BLADE SAGITTAL 13X1.27X60 (BLADE) ×2 IMPLANT
BLADE SAW SGTL 83.5X18.5 (BLADE) ×2 IMPLANT
BNDG CMPR 9X6 STRL LF SNTH (GAUZE/BANDAGES/DRESSINGS) ×1
BNDG CMPR MED 10X6 ELC LF (GAUZE/BANDAGES/DRESSINGS) ×1
BNDG ELASTIC 6X10 VLCR STRL LF (GAUZE/BANDAGES/DRESSINGS) ×1 IMPLANT
BNDG ESMARK 6X9 LF (GAUZE/BANDAGES/DRESSINGS) ×2
BOWL SMART MIX CTS (DISPOSABLE) ×2 IMPLANT
CATH KIT ON Q 5IN SLV (PAIN MANAGEMENT) ×2 IMPLANT
CEMENT BONE SIMPLEX SPEEDSET (Cement) ×4 IMPLANT
CLOTH BEACON ORANGE TIMEOUT ST (SAFETY) ×2 IMPLANT
COVER BACK TABLE 24X17X13 BIG (DRAPES) IMPLANT
COVER SURGICAL LIGHT HANDLE (MISCELLANEOUS) ×2 IMPLANT
CUFF TOURNIQUET SINGLE 34IN LL (TOURNIQUET CUFF) ×2 IMPLANT
DRAPE EXTREMITY T 121X128X90 (DRAPE) ×2 IMPLANT
DRAPE INCISE IOBAN 66X45 STRL (DRAPES) ×4 IMPLANT
DRAPE PROXIMA HALF (DRAPES) ×2 IMPLANT
DRAPE U-SHAPE 47X51 STRL (DRAPES) ×2 IMPLANT
DRSG ADAPTIC 3X8 NADH LF (GAUZE/BANDAGES/DRESSINGS) ×2 IMPLANT
DRSG PAD ABDOMINAL 8X10 ST (GAUZE/BANDAGES/DRESSINGS) ×2 IMPLANT
DURAPREP 26ML APPLICATOR (WOUND CARE) ×4 IMPLANT
ELECT REM PT RETURN 9FT ADLT (ELECTROSURGICAL) ×2
ELECTRODE REM PT RTRN 9FT ADLT (ELECTROSURGICAL) ×1 IMPLANT
EVACUATOR 1/8 PVC DRAIN (DRAIN) ×2 IMPLANT
GLOVE BIOGEL M 7.0 STRL (GLOVE) IMPLANT
GLOVE BIOGEL PI IND STRL 7.5 (GLOVE) IMPLANT
GLOVE BIOGEL PI IND STRL 8.5 (GLOVE) ×1 IMPLANT
GLOVE BIOGEL PI INDICATOR 7.5 (GLOVE)
GLOVE BIOGEL PI INDICATOR 8.5 (GLOVE) ×1
GLOVE BIOGEL PI ORTHO PRO SZ8 (GLOVE) ×1
GLOVE PI ORTHO PRO STRL SZ8 (GLOVE) ×1 IMPLANT
GLOVE SURG ORTHO 8.0 STRL STRW (GLOVE) ×4 IMPLANT
GOWN PREVENTION PLUS XLARGE (GOWN DISPOSABLE) ×4 IMPLANT
GOWN STRL NON-REIN LRG LVL3 (GOWN DISPOSABLE) ×4 IMPLANT
HANDPIECE INTERPULSE COAX TIP (DISPOSABLE) ×2
HOOD PEEL AWAY FACE SHEILD DIS (HOOD) ×8 IMPLANT
KIT BASIN OR (CUSTOM PROCEDURE TRAY) ×2 IMPLANT
KIT ROOM TURNOVER OR (KITS) ×2 IMPLANT
MANIFOLD NEPTUNE II (INSTRUMENTS) ×2 IMPLANT
NEEDLE 22X1 1/2 (OR ONLY) (NEEDLE) ×1 IMPLANT
NS IRRIG 1000ML POUR BTL (IV SOLUTION) ×2 IMPLANT
PACK TOTAL JOINT (CUSTOM PROCEDURE TRAY) ×2 IMPLANT
PAD ARMBOARD 7.5X6 YLW CONV (MISCELLANEOUS) ×4 IMPLANT
PADDING CAST COTTON 6X4 STRL (CAST SUPPLIES) ×2 IMPLANT
POSITIONER HEAD PRONE TRACH (MISCELLANEOUS) ×2 IMPLANT
SET HNDPC FAN SPRY TIP SCT (DISPOSABLE) ×1 IMPLANT
SPONGE GAUZE 4X4 12PLY (GAUZE/BANDAGES/DRESSINGS) ×2 IMPLANT
STAPLER VISISTAT 35W (STAPLE) ×2 IMPLANT
SUCTION FRAZIER TIP 10 FR DISP (SUCTIONS) ×2 IMPLANT
SUT BONE WAX W31G (SUTURE) ×2 IMPLANT
SUT VIC AB 0 CTB1 27 (SUTURE) ×4 IMPLANT
SUT VIC AB 1 CT1 27 (SUTURE) ×4
SUT VIC AB 1 CT1 27XBRD ANBCTR (SUTURE) ×2 IMPLANT
SUT VIC AB 2-0 CT1 27 (SUTURE) ×4
SUT VIC AB 2-0 CT1 TAPERPNT 27 (SUTURE) ×2 IMPLANT
SYR CONTROL 10ML LL (SYRINGE) ×1 IMPLANT
TOWEL OR 17X24 6PK STRL BLUE (TOWEL DISPOSABLE) ×2 IMPLANT
TOWEL OR 17X26 10 PK STRL BLUE (TOWEL DISPOSABLE) ×2 IMPLANT
TRAY FOLEY CATH 14FR (SET/KITS/TRAYS/PACK) ×2 IMPLANT
WATER STERILE IRR 1000ML POUR (IV SOLUTION) ×6 IMPLANT

## 2012-06-21 NOTE — Transfer of Care (Signed)
Immediate Anesthesia Transfer of Care Note  Patient: [name]  Procedure(s) Performed: Procedure(s) (LRB) with comments: TOTAL KNEE ARTHROPLASTY (Left)  Patient Location: PACU  Anesthesia Type:GA combined with regional for post-op pain  Level of Consciousness: awake, alert  and oriented  Airway & Oxygen Therapy: Patient Spontanous Breathing and Patient connected to nasal cannula oxygen  Post-op Assessment: Report given to PACU RN and Post -op Vital signs reviewed and stable  Post vital signs: Reviewed and stable  Complications: No apparent anesthesia complications

## 2012-06-21 NOTE — Anesthesia Postprocedure Evaluation (Signed)
Anesthesia Post Note  Patient: [name]  Procedure(s) Performed: Procedure(s) (LRB): TOTAL KNEE ARTHROPLASTY (Left)  Anesthesia type: general  Patient location: PACU  Post pain: Pain level controlled  Post assessment: Patient's Cardiovascular Status Stable  Last Vitals:  Filed Vitals:   06/21/12 1353  BP:   Pulse:   Temp: 36.4 C  Resp:     Post vital signs: Reviewed and stable  Level of consciousness: sedated  Complications: No apparent anesthesia complications

## 2012-06-21 NOTE — Op Note (Signed)
TOTAL KNEE REPLACEMENT OPERATIVE NOTE:  06/21/2012  12:13 PM  PATIENT:  [name]  78 y.o. male  PRE-OPERATIVE DIAGNOSIS:  osteoarthritis left knee  POST-OPERATIVE DIAGNOSIS:  osteoarthritis left knee  PROCEDURE:  Procedure(s): TOTAL KNEE ARTHROPLASTY  SURGEON:  Surgeon(s): Dannielle Huh, MD  PHYSICIAN ASSISTANT: Altamese Cabal, [facility]  ANESTHESIA:   general  DRAINS: Hemovac and On-Q Marcaine Pain Pump  SPECIMEN: None  COUNTS:  Correct  TOURNIQUET:   Total Tourniquet Time Documented: Thigh (Left) - 46 minutes  DICTATION:  Indication for procedure:    The patient is a 78 y.o. male who has failed conservative treatment for osteoarthritis left knee.  Informed consent was obtained prior to anesthesia. The risks versus benefits of the operation were explain and in a way the patient can, and did, understand.   Description of procedure:     The patient was taken to the operating room and placed under anesthesia.  The patient was positioned in the usual fashion taking care that all body parts were adequately padded and/or protected.  I foley catheter was not placed.  A tourniquet was applied and the leg prepped and draped in the usual sterile fashion.  The extremity was exsanguinated with the esmarch and tourniquet inflated to 350 mmHg.  Pre-operative range of motion was normal.  The knee was in 5 degree of mild varus.  A midline incision approximately 6-7 inches long was made with a #10 blade.  A new blade was used to make a parapatellar arthrotomy going 2-3 cm into the quadriceps tendon, over the patella, and alongside the medial aspect of the patellar tendon.  A synovectomy was then performed with the #10 blade and forceps. I then elevated the deep MCL off the medial tibial metaphysis subperiosteally around to the semimembranosus attachment.    I everted the patella and used calipers to measure patellar thickness.  I used the reamer to ream down to appropriate thickness to  recreate the native thickness.  I then removed excess bone with the rongeur and sagittal saw.  I used the appropriately sized template and drilled the three lug holes.  I then put the trial in place and measured the thickness with the calipers to ensure recreation of the native thickness.  The trial was then removed and the patella subluxed and the knee brought into flexion.  A homan retractor was place to retract and protect the patella and lateral structures.  A Z-retractor was place medially to protect the medial structures.  The extra-medullary alignment system was used to make cut the tibial articular surface perpendicular to the anamotic axis of the tibia and in 3 degrees of posterior slope.  The cut surface and alignment jig was removed.  I then used the intramedullary alignment guide to make a 6 valgus cut on the distal femur.  I then marked out the epicondylar axis on the distal femur.  The posterior condylar axis measured 3 degrees.  I then used the anterior referencing sizer and measured the femur to be a size F.  The 4-In-1 cutting block was screwed into place in external rotation matching the posterior condylar angle, making our cuts perpendicular to the epicondylar axis.  Anterior, posterior and chamfer cuts were made with the sagittal saw.  The cutting block and cut pieces were removed.  A lamina spreader was placed in 90 degrees of flexion.  The ACL, PCL, menisci, and posterior condylar osteophytes were removed.  A 10 mm spacer blocked was found to offer good flexion  and extension gap balance after moderate in degree releasing.   The scoop retractor was then placed and the femoral finishing block was pinned in place.  The small sagittal saw was used as well as the lug drill to finish the femur.  The block and cut surfaces were removed and the medullary canal hole filled with autograft bone from the cut pieces.  The tibia was delivered forward in deep flexion and external rotation.  A size 5  tray was selected and pinned into place centered on the medial 1/3 of the tibial tubercle.  The reamer and keel was used to prepare the tibia through the tray.    I then trialed with the size F femur, size 5 tibia, a 10 mm insert and the 35 patella.  I had excellent flexion/extension gap balance, excellent patella tracking.  Flexion was full and beyond 120 degrees; extension was zero.  These components were chosen and the staff opened them to me on the back table while the knee was lavaged copiously and the cement mixed.  I cemented in the components and removed all excess cement.  The polyethylene tibial component was snapped into place and the knee placed in extension while cement was hardening.  The capsule was infilltrated with 20cc of .25% Marcaine with epinephrine.  A hemovac was place in the joint exiting superolaterally.  A pain pump was place superomedially superficial to the arthrotomy.  Once the cement was hard, the tourniquet was let down.  Hemostasis was obtained.  The arthrotomy was closed with figure-8 #1 vicryl sutures.  The deep soft tissues were closed with #0 vicryls and the subcuticular layer closed with a running #2-0 vicryl.  The skin was reapproximated and closed with skin staples.  The wound was dressed with xeroform, 4 x4's, 2 ABD sponges, a single layer of webril and a TED stocking.   The patient was then awakened, extubated, and taken to the recovery room in stable condition.  BLOOD LOSS:  300cc DRAINS: 1 hemovac, 1 pain catheter COMPLICATIONS:  None.  PLAN OF CARE: Admit to inpatient   PATIENT DISPOSITION:  PACU - hemodynamically stable.   Delay start of Pharmacological VTE agent (>24hrs) due to surgical blood loss or risk of bleeding:  not applicable  Please fax a copy of this op note to my office at [phone] (please only include page 1 and 2 of the Case Information op note)

## 2012-06-21 NOTE — H&P (Signed)
  [name] MRN:  [id number] DOB/SEX:  05/24/1931/male  CHIEF COMPLAINT:  Painful left Knee  HISTORY: Patient is a 78 y.o. male presented with a history of pain in the left knee. Onset of symptoms was gradual starting several years ago with gradually worsening course since that time. The patient noted no past surgery on the left knee. Prior procedures on the knee include arthroscopy. Patient has been treated conservatively with over-the-counter NSAIDs and activity modification. Patient currently rates pain in the knee at 8 out of 10 with activity. There is pain at night.  PAST MEDICAL HISTORY: There are no active problems to display for this patient.  Past Medical History  Diagnosis Date  . Myocardial infarction 1982  . Coronary artery disease   . Hypertension   . Hyperlipemia   . Chronic kidney disease     followed by dr. fox for kidney function  . GERD (gastroesophageal reflux disease)     ulcer  . Arthritis   . AAA (abdominal aortic aneurysm)     by Dr. Dulce Sellar notes, 4.3 cm by U/S 05/2012   Past Surgical History  Procedure Date  . Kidney stents   . Finger amputation   . Finger surgery     on right hand from trauma as child  . Cholecystectomy   . Eye surgery     bilateral cataracts     MEDICATIONS:   No prescriptions prior to admission    ALLERGIES:   Allergies  Allergen Reactions  . Ace Inhibitors   . Plavix (Clopidogrel)     hives    REVIEW OF SYSTEMS:  Pertinent items are noted in HPI.   FAMILY HISTORY:  No family history on file.  SOCIAL HISTORY:   History  Substance Use Topics  . Smoking status: Never Smoker   . Smokeless tobacco: Not on file  . Alcohol Use: No     EXAMINATION:  Vital signs in last 24 hours:    General appearance: alert, cooperative and no distress Lungs: clear to auscultation bilaterally Heart: regular rate and rhythm, S1, S2 normal, no murmur, click, rub or gallop Abdomen: soft, non-tender; bowel sounds normal; no  masses,  no organomegaly Extremities: extremities normal, atraumatic, no cyanosis or edema and Homans sign is negative, no sign of DVT Pulses: 2+ and symmetric Skin: Skin color, texture, turgor normal. No rashes or lesions Neurologic: Alert and oriented X 3, normal strength and tone. Normal symmetric reflexes. Normal coordination and gait  Musculoskeletal:  ROM 0-120, Ligaments intact,  Imaging Review Plain radiographs demonstrate severe degenerative joint disease of the left knee. The overall alignment is neutral. The bone quality appears to be excellent for age and reported activity level.  Assessment/Plan: End stage arthritis, left knee   The patient history, physical examination and imaging studies are consistent with advanced degenerative joint disease of the left knee. The patient has failed conservative treatment.  The clearance notes were reviewed.  After discussion with the patient it was felt that Total Knee Replacement was indicated. The procedure,  risks, and benefits of total knee arthroplasty were presented and reviewed. The risks including but not limited to aseptic loosening, infection, blood clots, vascular injury, stiffness, patella tracking problems complications among others were discussed. The patient acknowledged the explanation, agreed to proceed with the plan.  Ceil Roderick 06/21/2012, 7:24 AM

## 2012-06-21 NOTE — Anesthesia Preprocedure Evaluation (Addendum)
Anesthesia Evaluation  Patient identified by MRN, date of birth, ID band Patient awake    Reviewed: Allergy & Precautions, H&P , NPO status   Airway Mallampati: II TM Distance: >3 FB Neck ROM: Full    Dental  (+) Dental Advisory Given, Missing and Chipped   Pulmonary          Cardiovascular hypertension, Pt. on medications and Pt. on home beta blockers + CAD and + Past MI     Neuro/Psych    GI/Hepatic GERD-  Medicated and Controlled,  Endo/Other    Renal/GU Renal InsufficiencyRenal disease     Musculoskeletal   Abdominal   Peds  Hematology   Anesthesia Other Findings   Reproductive/Obstetrics                          Anesthesia Physical Anesthesia Plan  ASA: III  Anesthesia Plan: General   Post-op Pain Management:    Induction: Intravenous  Airway Management Planned: LMA  Additional Equipment:   Intra-op Plan:   Post-operative Plan: Extubation in OR  Informed Consent: I have reviewed the patients History and Physical, chart, labs and discussed the procedure including the risks, benefits and alternatives for the proposed anesthesia with the patient or authorized representative who has indicated his/her understanding and acceptance.   Dental advisory given  Plan Discussed with: CRNA and Surgeon  Anesthesia Plan Comments:        Anesthesia Quick Evaluation

## 2012-06-21 NOTE — Evaluation (Signed)
Physical Therapy Evaluation Patient Details Name: [name] MRN: [id number] DOB: August 05, 1930 Today's Date: 06/21/2012 Time: 4098-1191 PT Time Calculation (min): 30 min  PT Assessment / Plan / Recommendation Clinical Impression  Patient is a 78 yo male s/p Lt. TKA.  Patient will benefit from acute PT to maximize independence prior to return home with wife.    PT Assessment  Patient needs continued PT services    Follow Up Recommendations  Home health PT;Supervision/Assistance - 24 hour    Does the patient have the potential to tolerate intense rehabilitation      Barriers to Discharge None      Equipment Recommendations  None recommended by PT    Recommendations for Other Services     Frequency 7X/week    Precautions / Restrictions Precautions Precautions: Knee Precaution Booklet Issued: Yes (comment) Precaution Comments: Reviewed precautions with patient. Restrictions Weight Bearing Restrictions: Yes LLE Weight Bearing: Weight bearing as tolerated   Pertinent Vitals/Pain Pain limiting mobility today.      Mobility  Bed Mobility Bed Mobility: Supine to Sit;Sit to Supine Supine to Sit: 3: Mod assist;With rails;HOB elevated Sit to Supine: 3: Mod assist;With rail;HOB elevated Details for Bed Mobility Assistance: Verbal cues for technique.  Assist to move LLE onto and off of bed.  Assist to raise trunk off bed. Transfers Transfers: Not assessed (Patient declined due to pain)       Exercises Total Joint Exercises Ankle Circles/Pumps: AROM;Both;10 reps;Supine   PT Diagnosis: Difficulty walking;Generalized weakness;Acute pain  PT Problem List: Decreased strength;Decreased range of motion;Decreased activity tolerance;Decreased mobility;Decreased knowledge of use of DME;Decreased knowledge of precautions;Pain PT Treatment Interventions: DME instruction;Gait training;Stair training;Functional mobility training;Therapeutic exercise;Patient/family education   PT  Goals Acute Rehab PT Goals PT Goal Formulation: With patient Time For Goal Achievement: 06/28/12 Potential to Achieve Goals: Good Pt will go Supine/Side to Sit: with supervision;with HOB 0 degrees PT Goal: Supine/Side to Sit - Progress: Goal set today Pt will go Sit to Supine/Side: with supervision;with HOB 0 degrees PT Goal: Sit to Supine/Side - Progress: Goal set today Pt will go Sit to Stand: with supervision;with upper extremity assist PT Goal: Sit to Stand - Progress: Goal set today Pt will go Stand to Sit: with supervision;with upper extremity assist PT Goal: Stand to Sit - Progress: Goal set today Pt will Ambulate: 51 - 150 feet;with supervision;with rolling walker PT Goal: Ambulate - Progress: Goal set today Pt will Go Up / Down Stairs: 3-5 stairs;with min assist;with rail(s);with least restrictive assistive device PT Goal: Up/Down Stairs - Progress: Goal set today Pt will Perform Home Exercise Program: with supervision, verbal cues required/provided PT Goal: Perform Home Exercise Program - Progress: Goal set today  Visit Information  Last PT Received On: 06/21/12 Assistance Needed: +1    Subjective Data  Subjective: "I think that's all I can do today." (When asked patient to try to stand) Patient Stated Goal: To return home with wife   Prior Functioning  Home Living Lives With: Spouse Available Help at Discharge: Family;Available 24 hours/day Type of Home: House Home Access: Stairs to enter [employer] of Steps: 3 Entrance Stairs-Rails: Right;Left Home Layout: One level Bathroom Shower/Tub: Health visitor: Handicapped height Bathroom Accessibility: Yes How Accessible: Accessible via walker Home Adaptive Equipment: Bedside commode/3-in-1;Shower chair with back;Walker - rolling;Straight cane;Crutches Prior Function Level of Independence: Independent with assistive device(s);Needs assistance Needs Assistance: Light Housekeeping;Meal  Prep Meal Prep: Moderate Light Housekeeping: Total Able to Take Stairs?: Yes Driving: Yes Vocation:  Retired Musician: No difficulties    Cognition  Overall Cognitive Status: Appears within functional limits for tasks assessed/performed Arousal/Alertness: Awake/alert Orientation Level: Appears intact for tasks assessed Behavior During Session: [facility] for tasks performed    Extremity/Trunk Assessment Right Upper Extremity Assessment RUE ROM/Strength/Tone: WFL for tasks assessed RUE Sensation: WFL - Light Touch Left Upper Extremity Assessment LUE ROM/Strength/Tone: WFL for tasks assessed LUE Sensation: WFL - Light Touch Right Lower Extremity Assessment RLE ROM/Strength/Tone: WFL for tasks assessed RLE Sensation: WFL - Light Touch Left Lower Extremity Assessment LLE ROM/Strength/Tone: Deficits;Unable to fully assess;Due to pain LLE ROM/Strength/Tone Deficits: Able to assist moving LE off bed. Trunk Assessment Trunk Assessment: Normal   Balance Balance Balance Assessed: Yes Static Sitting Balance Static Sitting - Balance Support: No upper extremity supported;Feet supported Static Sitting - Level of Assistance: 5: Stand by assistance Static Sitting - Comment/# of Minutes: Patient able to sit on EOB x 10 minutes as he provided PLOF and living situation information.  Good balance.  Returned to supine due to increased pain in LLE.  End of Session PT - End of Session Equipment Utilized During Treatment: Oxygen Activity Tolerance: Patient limited by pain Patient left: in bed;with call bell/phone within reach Nurse Communication: Mobility status CPM Left Knee CPM Left Knee: Off  GP     Vena Austria 06/21/2012, 7:31 PM Durenda Hurt. Renaldo Fiddler, [facility] Acute [facility] Pager [phone]

## 2012-06-21 NOTE — Progress Notes (Signed)
Orthopedic Tech Progress Note Patient Details:  [name] [birth year] [id number] CPM applied to Left LE with appropriate settings; OHF applied to bed CPM Left Knee CPM Left Knee: On Left Knee Flexion (Degrees): 90  Left Knee Extension (Degrees): 0    Asia R Thompson 06/21/2012, 1:36 PM

## 2012-06-21 NOTE — Progress Notes (Signed)
UR COMPLETED  

## 2012-06-21 NOTE — Preoperative (Signed)
Beta Blockers   Reason not to administer Beta Blockers:Not Applicable 

## 2012-06-21 NOTE — Progress Notes (Signed)
This note also relates to the following rows which could not be included:  SpO2 - Cannot attach notes to unvalidated device data

## 2012-06-21 NOTE — Progress Notes (Signed)
Providing relief for angel RN

## 2012-06-21 NOTE — Anesthesia Procedure Notes (Addendum)
Anesthesia Regional Block:  Femoral nerve block  Pre-Anesthetic Checklist: ,, timeout performed, Correct Patient, Correct Site, Correct Laterality, Correct Procedure, Correct Position, site marked, Risks and benefits discussed,  Surgical consent,  Pre-op evaluation,  At surgeon's request and post-op pain management  Laterality: Left  Prep: chloraprep       Needles:  Injection technique: Single-shot  Needle Type: Echogenic Stimulator Needle          Additional Needles:  Procedures: ultrasound guided (picture in chart) and nerve stimulator Femoral nerve block  Nerve Stimulator or Paresthesia:  Response: 0.4 mA,   Additional Responses:   Narrative:  Start time: 06/21/2012 9:50 AM End time: 06/21/2012 10:00 AM Injection made incrementally with aspirations every 5 mL.  Performed by: Personally  Anesthesiologist: Arta Bruce MD  Additional Notes: Monitors applied. Patient sedated. Sterile prep and drape,hand hygiene and sterile gloves were used. Relevant anatomy identified.Needle position confirmed.Local anesthetic injected incrementally after negative aspiration. Local anesthetic spread visualized around nerve(s). Vascular puncture avoided. No complications. Image printed for medical record.The patient tolerated the procedure well.       Femoral nerve block Procedure Name: LMA Insertion Date/Time: 06/21/2012 10:20 AM Performed by: Rogelia Boga Pre-anesthesia Checklist: Patient identified, Emergency Drugs available, Suction available, Patient being monitored and Timeout performed Patient Re-evaluated:Patient Re-evaluated prior to inductionOxygen Delivery Method: Circle system utilized Preoxygenation: Pre-oxygenation with 100% oxygen Intubation Type: IV induction LMA: LMA inserted LMA Size: 5.0 Tube type: Oral Number of attempts: 1 Placement Confirmation: positive ETCO2 and breath sounds checked- equal and bilateral Tube secured with: Tape Dental Injury: Teeth  and Oropharynx as per pre-operative assessment

## 2012-06-22 ENCOUNTER — Encounter (HOSPITAL_COMMUNITY): Payer: Self-pay | Admitting: Orthopedic Surgery

## 2012-06-22 LAB — BASIC METABOLIC PANEL
CO2: 29 mEq/L (ref 19–32)
Chloride: 95 mEq/L — ABNORMAL LOW (ref 96–112)
GFR calc Af Amer: 31 mL/min — ABNORMAL LOW (ref >90)
Potassium: 3.9 mEq/L (ref 3.5–5.1)

## 2012-06-22 LAB — CBC
Platelets: 192 10*3/uL (ref 150–400)
RBC: 3.31 MIL/uL — ABNORMAL LOW (ref 4.22–5.81)
RDW: 16.4 % — ABNORMAL HIGH (ref 11.5–15.5)
WBC: 12.7 10*3/uL — ABNORMAL HIGH (ref 4.0–10.5)

## 2012-06-22 NOTE — Evaluation (Signed)
Occupational Therapy Evaluation Patient Details Name: [name] MRN: [id number] DOB: [birth year] Today's Date: 06/22/2012 Time: 7829-5621 OT Time Calculation (min): 35 min  OT Assessment / Plan / Recommendation Clinical Impression  78 yo male s/p Lt TKA that could benefit from skilled OT acutely. OT to follow acutely    OT Assessment  Patient needs continued OT Services    Follow Up Recommendations  Home health OT    Barriers to Discharge      Equipment Recommendations  None recommended by PT;None recommended by OT    Recommendations for Other Services    Frequency  Min 2X/week    Precautions / Restrictions Precautions Precautions: Knee Restrictions LLE Weight Bearing: Weight bearing as tolerated   Pertinent Vitals/Pain CPM on 2:15PM- educated 2 hours on  Educated on how to turn machine off and to call RN    ADL  Eating/Feeding: Set up Where Assessed - Eating/Feeding: Chair Toilet Transfer: Hydrographic surveyor Method: Sit to Barista: Raised toilet seat with arms (or 3-in-1 over toilet) Equipment Used: Rolling walker;Gait belt Transfers/Ambulation Related to ADLs: Pt ambulated into the bathroom with decr gait velocity and required min v/c for upright posture. pt looking down at toes ADL Comments: pt and family educated on bed mobility. Pt states I think i need to do that a few more times before I go home to get it right. Pt son requesting to help patient and practice prior to d/c home. Pt progressing well on initial evaluation    OT Diagnosis: Generalized weakness;Acute pain  OT Problem List: Decreased strength;Decreased activity tolerance;Impaired balance (sitting and/or standing);Decreased safety awareness;Decreased knowledge of use of DME or AE;Decreased knowledge of precautions;Pain OT Treatment Interventions: Self-care/ADL training;DME and/or AE instruction;Therapeutic activities;Patient/family education;Balance training   OT  Goals Acute Rehab OT Goals OT Goal Formulation: With patient Time For Goal Achievement: 07/06/12 Potential to Achieve Goals: Good ADL Goals Pt Will Perform Grooming: with modified independence;Standing at sink;Unsupported ADL Goal: Grooming - Progress: Goal set today Pt Will Perform Lower Body Bathing: with set-up;Sit to stand from chair;with adaptive equipment;Unsupported ADL Goal: Lower Body Bathing - Progress: Goal set today Pt Will Perform Lower Body Dressing: with set-up;Unsupported;Sit to stand from chair;with adaptive equipment ADL Goal: Lower Body Dressing - Progress: Goal set today Pt Will Transfer to Toilet: with set-up;Ambulation;3-in-1 ADL Goal: Toilet Transfer - Progress: Goal set today Miscellaneous OT Goals Miscellaneous OT Goal #1: Pt will perform bed mobility supervision level with son (A) PRN as precursor to adls OT Goal: Miscellaneous Goal #1 - Progress: Goal set today  Visit Information  Last OT Received On: 06/22/12 Assistance Needed: +1    Subjective Data  Subjective: I haven't been in that machine today. How long am I suppose to do at a time?- pt educated on 2 hours in CPM each time Patient Stated Goal: to go home   Prior Functioning     Home Living Lives With: Spouse Available Help at Discharge: Family;Available 24 hours/day Type of Home: House Home Access: Stairs to enter [employer] of Steps: 3 Entrance Stairs-Rails: Right;Left Home Layout: One level Bathroom Shower/Tub: Health visitor: Handicapped height Bathroom Accessibility: Yes How Accessible: Accessible via walker Home Adaptive Equipment: Bedside commode/3-in-1;Shower chair with back;Walker - rolling;Straight cane;Crutches Prior Function Level of Independence: Independent with assistive device(s);Needs assistance Needs Assistance: Light Housekeeping;Meal Prep Meal Prep: Moderate Light Housekeeping: Total Able to Take Stairs?: Yes Driving: Yes Vocation:  Retired Musician: No difficulties Dominant Hand: Right  Vision/Perception     Cognition  Overall Cognitive Status: Appears within functional limits for tasks assessed/performed Arousal/Alertness: Awake/alert Orientation Level: Appears intact for tasks assessed Behavior During Session: [facility] for tasks performed    Extremity/Trunk Assessment Right Upper Extremity Assessment RUE ROM/Strength/Tone: [facility] for tasks assessed Left Upper Extremity Assessment LUE ROM/Strength/Tone: WFL for tasks assessed Trunk Assessment Trunk Assessment: Normal     Mobility Bed Mobility Supine to Sit: 4: Min assist Sit to Supine: 4: Min guard Details for Bed Mobility Assistance: Min A for LLE out of bed Transfers Sit to Stand: 4: Min guard;With upper extremity assist;From bed;From chair/3-in-1 Stand to Sit: 4: Min guard;With upper extremity assist;To bed;To chair/3-in-1 Details for Transfer Assistance: Cues for safe technique     Shoulder Instructions     Exercise     Balance     End of Session OT - End of Session Activity Tolerance: Patient tolerated treatment well Patient left: in bed;with call bell/phone within reach (in CPM starting at 2:15PM) Nurse Communication: Mobility status;Precautions (RN and tech informed CPM removed at 4:15) CPM Left Knee Left Knee Flexion (Degrees): 80  Left Knee Extension (Degrees): 0  Additional Comments: RN notified  GO     Lucile Shutters 06/22/2012, 2:55 PM

## 2012-06-22 NOTE — Progress Notes (Signed)
CARE MANAGEMENT NOTE 06/22/2012  Patient:  [name], [name]   Account Number:  [id number]  Date Initiated:  06/22/2012  Documentation initiated by:  Vance Peper  Subjective/Objective Assessment:   78 yr old male s/p left total knee arthroplasty.     Action/Plan:   CM spoke with patient concerning home health and DME needs.Patient preoperatively setup with [facility], no changes.   Anticipated DC Date:  06/23/2012   Anticipated DC Plan:  HOME W HOME HEALTH SERVICES      DC Planning Services  CM consult      [facility] Choice  HOME HEALTH   Choice offered to / List presented to:  C-1 Patient        HH arranged  HH-2 PT      [facility]  [facility]   Status of service:  Completed, signed off Medicare Important Message given?   (If response is "NO", the following Medicare IM given date fields will be blank) Date Medicare IM given:   Date Additional Medicare IM given:    Discharge Disposition:  HOME W HOME HEALTH SERVICES  Per UR Regulation:    If discussed at Long Length of Stay Meetings, dates discussed:    Comments:

## 2012-06-22 NOTE — Progress Notes (Signed)
Physical Therapy Treatment Patient Details Name: [name] MRN: [id number] DOB: [birth year] Today's Date: 06/22/2012 Time: 0832-0908 PT Time Calculation (min): 36 min  PT Assessment / Plan / Recommendation Comments on Treatment Session  Patient progressing with ambulation this session. Complains of some dizziness but subsided as session progressed. Patients family in to see him ambulate. Son will be with him at home. Will attempt long ambulation next session    Follow Up Recommendations  Home health PT;Supervision/Assistance - 24 hour     Does the patient have the potential to tolerate intense rehabilitation     Barriers to Discharge        Equipment Recommendations  None recommended by PT    Recommendations for Other Services    Frequency 7X/week   Plan Discharge plan remains appropriate;Frequency remains appropriate    Precautions / Restrictions Precautions Precautions: Knee Restrictions Weight Bearing Restrictions: Yes LLE Weight Bearing: Weight bearing as tolerated   Pertinent Vitals/Pain     Mobility  Bed Mobility Supine to Sit: 4: Min assist;With rails;HOB elevated Details for Bed Mobility Assistance: Patient required cues for sequencing and A for L LE and to initate shoulder up out of bed Transfers Transfers: Sit to Stand;Stand to Sit Sit to Stand: 4: Min assist;With upper extremity assist;From bed Stand to Sit: 4: Min assist;With upper extremity assist;To chair/3-in-1 Details for Transfer Assistance: A to initate stand and for stability. Cues for safe technique and hand placement Ambulation/Gait Ambulation/Gait Assistance: 4: Min assist Ambulation Distance (Feet): 20 Feet Assistive device: Rolling walker Ambulation/Gait Assistance Details: Cues for gait sequence and management of RW. Noted no buckling of LLE. Patient with good posture throughout Gait Pattern: Step-to pattern;Trunk flexed Gait velocity: decreased    Exercises Total Joint Exercises Quad  Sets: AROM;Left;10 reps Heel Slides: Left;10 reps;AAROM Hip ABduction/ADduction: AAROM;Left;10 reps   PT Diagnosis:    PT Problem List:   PT Treatment Interventions:     PT Goals Acute Rehab PT Goals PT Goal: Supine/Side to Sit - Progress: Progressing toward goal PT Goal: Sit to Stand - Progress: Progressing toward goal PT Goal: Stand to Sit - Progress: Progressing toward goal PT Goal: Ambulate - Progress: Progressing toward goal  Visit Information  Last PT Received On: 06/22/12 Assistance Needed: +1    Subjective Data      Cognition  Overall Cognitive Status: Appears within functional limits for tasks assessed/performed Arousal/Alertness: Awake/alert Orientation Level: Appears intact for tasks assessed Behavior During Session: [facility] for tasks performed    Balance     End of Session PT - End of Session Equipment Utilized During Treatment: Gait belt Activity Tolerance: Patient tolerated treatment well Patient left: in chair;with call bell/phone within reach Nurse Communication: Mobility status CPM Left Knee CPM Left Knee: Off   GP     Fredrich Birks 06/22/2012, 9:17 AM 06/22/2012 Fredrich Birks PTA [phone] pager [phone] office

## 2012-06-22 NOTE — Progress Notes (Signed)
SPORTS MEDICINE AND JOINT REPLACEMENT  Georgena Spurling, MD   Altamese Cabal, PA-C [address] [place], [place], [place]  [address]                             [phone]   PROGRESS NOTE  Subjective:  negative for Chest Pain  negative for Shortness of Breath  negative for Nausea/Vomiting   negative for Calf Pain  negative for Bowel Movement   Tolerating Diet: yes         Patient reports pain as 3 on 0-10 scale.    Objective: Vital signs in last 24 hours:   Patient Vitals for the past 24 hrs:  BP Temp Temp src Pulse Resp SpO2  06/22/12 0537 115/81 mmHg 98.1 F (36.7 C) Oral 86  16  99 %  06/21/12 2340 134/67 mmHg 97.4 F (36.3 C) Oral 82  16  98 %  06/21/12 2000 - - - - 18  100 %    @flow {1959:LAST@   Intake/Output from previous day:   11/18 0701 - 11/19 0700 In: 2225 [P.O.:600; I.V.:1625] Out: 690 [Drains:625]   Intake/Output this shift:       Intake/Output      11/18 0701 - 11/19 0700 11/19 0701 - 11/20 0700   P.O. 600    I.V. 1625    Total Intake 2225    Drains 625    Blood 65    Total Output 690    Net +1535            LABORATORY DATA:  Basename 06/22/12 0450 06/21/12 0823  WBC 12.7* --  HGB 10.0* 12.6*  HCT 31.2* 37.0*  PLT 192 --    Basename 06/22/12 0450 06/21/12 0823  NA 136 140  K 3.9 3.6  CL 95* 100  CO2 29 --  BUN 39* 41*  CREATININE 2.19* 2.20*  GLUCOSE 127* 149*  CALCIUM 8.1* --   Lab Results  Component Value Date   INR 0.96 06/11/2012   INR 0.87 05/14/2011    Examination:  General appearance: alert, cooperative and no distress Extremities: Homans sign is negative, no sign of DVT  Wound Exam: clean, dry, intact   Drainage:  Scant/small amount Serosanguinous exudate  Motor Exam: EHL and FHL Intact  Sensory Exam: Deep Peroneal normal  Vascular Exam:    Assessment:    1 Day Post-Op  Procedure(s) (LRB): TOTAL KNEE ARTHROPLASTY (Left)  ADDITIONAL DIAGNOSIS:  Active Problems:  * No active hospital problems. *    Acute Blood Loss Anemia   Plan: Physical Therapy as ordered Weight Bearing as Tolerated (WBAT)  DVT Prophylaxis:  Lovenox  DISCHARGE PLAN: Home  DISCHARGE NEEDS: HHPT, CPM, Walker and 3-in-1 comode seat         Leilanny Fluitt 06/22/2012, 5:25 PM

## 2012-06-22 NOTE — Progress Notes (Signed)
Physical Therapy Treatment Patient Details Name: [name] MRN: [id number] DOB: 1930/09/04 Today's Date: 06/22/2012 Time: 4098-1191 PT Time Calculation (min): 32 min  PT Assessment / Plan / Recommendation Comments on Treatment Session  Patient making slow progress this afternoon. Able to ambulate to restroom and back. Continue with current POC.    Follow Up Recommendations  Home health PT;Supervision/Assistance - 24 hour     Does the patient have the potential to tolerate intense rehabilitation     Barriers to Discharge        Equipment Recommendations  None recommended by PT    Recommendations for Other Services    Frequency 7X/week   Plan Discharge plan remains appropriate;Frequency remains appropriate    Precautions / Restrictions Precautions Precautions: Knee Restrictions LLE Weight Bearing: Weight bearing as tolerated   Pertinent Vitals/Pain     Mobility  Bed Mobility Supine to Sit: 4: Min assist Sit to Supine: 4: Min guard Details for Bed Mobility Assistance: Min A for LLE out of bed Transfers Sit to Stand: 4: Min guard;With upper extremity assist;From bed;From chair/3-in-1 Stand to Sit: 4: Min guard;With upper extremity assist;To bed;To chair/3-in-1 Details for Transfer Assistance: Cues for safe technique Ambulation/Gait Ambulation/Gait Assistance: 4: Min guard Ambulation Distance (Feet): 30 Feet Assistive device: Rolling walker Ambulation/Gait Assistance Details: Cues for step length and posture. Patient has tendency to look down at his feet with ambulation Gait Pattern: Step-through pattern;Decreased stride length;Trunk flexed    Exercises     PT Diagnosis:    PT Problem List:   PT Treatment Interventions:     PT Goals Acute Rehab PT Goals PT Goal: Supine/Side to Sit - Progress: Progressing toward goal PT Goal: Sit to Supine/Side - Progress: Progressing toward goal PT Goal: Sit to Stand - Progress: Progressing toward goal PT Goal: Stand to Sit  - Progress: Progressing toward goal PT Goal: Ambulate - Progress: Progressing toward goal  Visit Information  Last PT Received On: 06/22/12 Assistance Needed: +1    Subjective Data      Cognition  Overall Cognitive Status: Appears within functional limits for tasks assessed/performed Arousal/Alertness: Awake/alert Orientation Level: Appears intact for tasks assessed Behavior During Session: [facility] for tasks performed    Balance     End of Session PT - End of Session Equipment Utilized During Treatment: Gait belt Activity Tolerance: Patient tolerated treatment well Patient left: in chair;with call bell/phone within reach Nurse Communication: Mobility status CPM Left Knee Left Knee Flexion (Degrees): 80  Left Knee Extension (Degrees): 0  Additional Comments: RN notified   GP     Fredrich Birks 06/22/2012, 2:41 PM 06/22/2012 Fredrich Birks PTA [phone] pager [phone] office

## 2012-06-23 LAB — CBC
HCT: 27.9 % — ABNORMAL LOW (ref 39.0–52.0)
Hemoglobin: 9.1 g/dL — ABNORMAL LOW (ref 13.0–17.0)
RBC: 3.01 MIL/uL — ABNORMAL LOW (ref 4.22–5.81)
WBC: 13 10*3/uL — ABNORMAL HIGH (ref 4.0–10.5)

## 2012-06-23 MED ORDER — CELECOXIB 200 MG PO CAPS: 200.0000 mg | ORAL_CAPSULE | Freq: Two times a day (BID) | ORAL | Status: DC

## 2012-06-23 MED ORDER — METHOCARBAMOL 500 MG PO TABS: 500.0000 mg | ORAL_TABLET | Freq: Four times a day (QID) | ORAL | Status: DC | PRN

## 2012-06-23 MED ORDER — ENOXAPARIN SODIUM 40 MG/0.4ML ~~LOC~~ SOLN: 40.0000 mg | Freq: Two times a day (BID) | SUBCUTANEOUS | Status: DC

## 2012-06-23 MED ORDER — OXYCODONE HCL ER 10 MG PO T12A: 10.0000 mg | EXTENDED_RELEASE_TABLET | Freq: Two times a day (BID) | ORAL | Status: DC

## 2012-06-23 MED ORDER — SODIUM CHLORIDE 0.9 % IV BOLUS (SEPSIS): 500.0000 mL | Freq: Once | INTRAVENOUS | Status: DC

## 2012-06-23 MED ORDER — OXYCODONE HCL 5 MG PO TABS: 5.0000 mg | ORAL_TABLET | ORAL | Status: DC | PRN

## 2012-06-23 NOTE — Progress Notes (Signed)
SPORTS MEDICINE AND JOINT REPLACEMENT  Georgena Spurling, MD   Altamese Cabal, PA-C [address] [place], [place], [place]  [address]                             [phone]   PROGRESS NOTE  Subjective:  negative for Chest Pain  negative for Shortness of Breath  negative for Nausea/Vomiting   negative for Calf Pain  negative for Bowel Movement   Tolerating Diet: yes         Patient reports pain as 4 on 0-10 scale.    Objective: Vital signs in last 24 hours:   Patient Vitals for the past 24 hrs:  BP Temp Pulse Resp SpO2  06/23/12 1231 90/53 mmHg - - - -  06/23/12 0800 93/56 mmHg - - - -  06/23/12 0657 120/57 mmHg 100.5 F (38.1 C) 70  18  92 %  06/23/12 0400 - - - 18  94 %  06/23/12 0000 - - - 18  93 %  06/22/12 2321 116/65 mmHg 99.6 F (37.6 C) 77  18  92 %  06/22/12 2000 - - - 18  98 %  06/22/12 1400 118/55 mmHg 98.2 F (36.8 C) 63  20  97 %    @flow {1959:LAST@   Intake/Output from previous day:   11/19 0701 - 11/20 0700 In: 960 [P.O.:960] Out: 800 [Urine:800]   Intake/Output this shift:   11/20 0701 - 11/20 1900 In: 240 [P.O.:240] Out: -    Intake/Output      11/19 0701 - 11/20 0700 11/20 0701 - 11/21 0700   P.O. 960 240   I.V.     Total Intake 960 240   Urine 800    Drains     Blood     Total Output 800    Net +160 +240           LABORATORY DATA:  Basename 06/23/12 0425 06/22/12 0450 06/21/12 0823  WBC 13.0* 12.7* --  HGB 9.1* 10.0* 12.6*  HCT 27.9* 31.2* 37.0*  PLT 165 192 --    Basename 06/22/12 0450 06/21/12 0823  NA 136 140  K 3.9 3.6  CL 95* 100  CO2 29 --  BUN 39* 41*  CREATININE 2.19* 2.20*  GLUCOSE 127* 149*  CALCIUM 8.1* --   Lab Results  Component Value Date   INR 0.96 06/11/2012   INR 0.87 05/14/2011    Examination:  General appearance: alert, cooperative and no distress Extremities: Homans sign is negative, no sign of DVT  Wound Exam: clean, dry, intact   Drainage:  None: wound tissue dry  Motor Exam: EHL and FHL  Intact  Sensory Exam: Deep Peroneal normal  Vascular Exam:    Assessment:    2 Days Post-Op  Procedure(s) (LRB): TOTAL KNEE ARTHROPLASTY (Left)  ADDITIONAL DIAGNOSIS:  Active Problems:  * No active hospital problems. *   Acute Blood Loss Anemia   Plan: Physical Therapy as ordered Weight Bearing as Tolerated (WBAT)  DVT Prophylaxis:  Lovenox  DISCHARGE PLAN: Home  DISCHARGE NEEDS: HHPT, CPM, Walker and 3-in-1 comode seat         Haedyn Breau 06/23/2012, 1:00 PM

## 2012-06-23 NOTE — Progress Notes (Signed)
Occupational Therapy Treatment Patient Details Name: [name] MRN: [id number] DOB: Oct 28, 1930 Today's Date: 06/23/2012 Time: 4098-1191 OT Time Calculation (min): 53 min  OT Assessment / Plan / Recommendation Comments on Treatment Session Pt progressing well and adequate level for d/c home.     Follow Up Recommendations  Home health OT    Barriers to Discharge       Equipment Recommendations  None recommended by OT    Recommendations for Other Services    Frequency Min 2X/week   Plan Discharge plan remains appropriate    Precautions / Restrictions Precautions Precautions: Knee Restrictions LLE Weight Bearing: Weight bearing as tolerated   Pertinent Vitals/Pain Fatigue reported BP 90/53 during session Pt family concerned with pt not having bowel movement and not having "fluid pill" since arrival    ADL  Eating/Feeding: Set up Where Assessed - Eating/Feeding: Chair Lower Body Dressing: Moderate assistance Where Assessed - Lower Body Dressing: Unsupported sit to stand Toilet Transfer: Min Pension scheme manager Method: Sit to stand Toilet Transfer Equipment: Raised toilet seat with arms (or 3-in-1 over toilet) Toileting - Clothing Manipulation and Hygiene: Moderate assistance Where Assessed - Toileting Clothing Manipulation and Hygiene: Sit to stand from 3-in-1 or toilet Equipment Used: Rolling walker;Gait belt Transfers/Ambulation Related to ADLs: Pt ambulated to bathroom with son to practice for home. pt requires extended time. Pt with decr gait velocity.  ADL Comments: Pt completed bed mobility, dressing, toilet transfer, ambulation and chair transfer with son (A) . Wife was present for all education. Pt progressing well and at adequate level for d/c home. pt is deconditioned and needs HHOT for activity tolerance    OT Diagnosis:    OT Problem List:   OT Treatment Interventions:     OT Goals Acute Rehab OT Goals OT Goal Formulation: With patient Time For Goal  Achievement: 07/06/12 Potential to Achieve Goals: Good ADL Goals Pt Will Perform Grooming: with modified independence;Standing at sink;Unsupported ADL Goal: Grooming - Progress: Progressing toward goals Pt Will Perform Lower Body Bathing: with set-up;Sit to stand from chair;with adaptive equipment;Unsupported ADL Goal: Lower Body Bathing - Progress: Progressing toward goals Pt Will Perform Lower Body Dressing: with set-up;Unsupported;Sit to stand from chair;with adaptive equipment ADL Goal: Lower Body Dressing - Progress: Progressing toward goals Pt Will Transfer to Toilet: with set-up;Ambulation;3-in-1 ADL Goal: Toilet Transfer - Progress: Progressing toward goals Miscellaneous OT Goals Miscellaneous OT Goal #1: Pt will perform bed mobility supervision level with son (A) PRN as precursor to adls OT Goal: Miscellaneous Goal #1 - Progress: Progressing toward goals  Visit Information  Last OT Received On: 06/23/12 Assistance Needed: +1    Subjective Data      Prior Functioning       Cognition  Overall Cognitive Status: Appears within functional limits for tasks assessed/performed Arousal/Alertness: Awake/alert Orientation Level: Appears intact for tasks assessed Behavior During Session: [facility] for tasks performed    Mobility  Shoulder Instructions Bed Mobility Bed Mobility: Supine to Sit;Sitting - Scoot to Edge of Bed Supine to Sit: 4: Min assist Sitting - Scoot to [place] of Bed: 4: Min assist Sit to Supine: 4: Min assist Details for Bed Mobility Assistance: son educated on hand placement and good recall from education on 06/22/12 Transfers Transfers: Sit to Stand;Stand to Sit Sit to Stand: 4: Min guard;With upper extremity assist;From bed Stand to Sit: 4: Min guard;With upper extremity assist;To chair/3-in-1 Details for Transfer Assistance: educated on Lt LE placement for sit<>stand. Pt was attempting to hold LT LE  off the floor       Exercises      Balance     End of  Session OT - End of Session Activity Tolerance: Patient tolerated treatment well Patient left: in chair;with call bell/phone within reach Nurse Communication: Mobility status;Precautions  GO     Lucile Shutters 06/23/2012, 2:30 PM

## 2012-06-23 NOTE — Progress Notes (Signed)
Physical Therapy Treatment Note   06/23/12 0752  PT Visit Information  Last PT Received On 06/23/12  Assistance Needed +1  PT Time Calculation  PT Start Time 0752  PT Stop Time 0840  PT Time Calculation (min) 48 min  Subjective Data  Subjective Pt received sitting up in bed with request "I need to move my butt is sore."  Precautions  Precautions Knee  Restrictions  Weight Bearing Restrictions Yes  LLE Weight Bearing WBAT  Cognition  Overall Cognitive Status Appears within functional limits for tasks assessed/performed  Arousal/Alertness Awake/alert  Orientation Level Appears intact for tasks assessed  Behavior During Session [facility] for tasks performed  Bed Mobility  Bed Mobility Supine to Sit  Supine to Sit 2: Max assist (without rails and HOB flat to mimic home set up)  Details for Bed Mobility Assistance pt reports ill sleep in a recliner  Transfers  Transfers Sit to Stand;Stand to Sit  Sit to Stand 4: Min assist;With upper extremity assist;From bed  Stand to Sit 4: Min guard;With upper extremity assist;To bed;To chair/3-in-1  Details for Transfer Assistance v/c's for safe hand placement, increased time, pt took 2 attempts to stand up  Ambulation/Gait  Ambulation/Gait Assistance 4: Min guard  Ambulation Distance (Feet) 20 Feet  Assistive device Rolling walker  Ambulation/Gait Assistance Details pt extremely slow requiring freq standing rest breaks to "catch my breath" and "give my arms a rest". v/c's for safe walker management and sequencing  Gait Pattern Step-to pattern;Decreased stride length  Gait velocity extremely slow, took 16 minutes to amb 20 feet (one side of the bed to the other)  Stairs No (will do at second session when son is here)  Total Joint Exercises  Ankle Circles/Pumps AROM;Both;10 reps;Seated  Quad Sets AROM;Left;10 reps  Long Arc Quad AROM;Left;10 reps;Seated  Knee Flexion AROM;Both;10 reps;Supine (achieved 30 deg active, 50 deg passive)  PT - End of  Session  Equipment Utilized During Treatment Gait belt  Activity Tolerance Patient tolerated treatment well  Patient left in chair;with call bell/phone within reach;with nursing in room  Nurse Communication Mobility status (BP at 87/51 and 91/57)  PT - Assessment/Plan  Comments on Treatment Session Pt making slow progress and c/o of being "hot" during session. Pt diapheretic, RN present and aware. BP taken at 87/51 immeadiately after walking then after few min of sitting BP at 91/57. Will trial 3 steps to mimic home set up this afternoon when son is present. Patient remains to require assist for bed mobility and has decreased ambulation tolerance. Pt to con't to benefit from skilled PT to maximize L knee ROM and L LE strength to return to I function.  PT Plan Discharge plan remains appropriate  PT Frequency 7X/week  Follow Up Recommendations Home health PT;Supervision/Assistance - 24 hour  Equipment Recommended None recommended by PT  Acute Rehab PT Goals  PT Goal: Supine/Side to Sit - Progress Progressing toward goal  PT Goal: Sit to Stand - Progress Progressing toward goal  PT Goal: Stand to Sit - Progress Progressing toward goal  PT Goal: Ambulate - Progress Progressing toward goal  PT Goal: Perform Home Exercise Program - Progress Progressing toward goal  PT General Charges  $$ ACUTE PT VISIT 1 Procedure  PT Treatments  $Gait Training 23-37 mins  $Therapeutic Exercise 8-22 mins    Pain: 1/10 upon arrival in L knee, increased pain with ROM however pt did not rate  Lewis Shock, PT, DPT Pager #: [phone] Office #: [phone]

## 2012-06-23 NOTE — Progress Notes (Signed)
Physical Therapy Treatment Patient Details Name: [name] MRN: [id number] DOB: 1930-11-08 Today's Date: 06/23/2012 Time: 4098-1191 PT Time Calculation (min): 39 min  PT Assessment / Plan / Recommendation Comments on Treatment Session  Pt had just returned to bed upon arrival but was agreeable to participate in PT session.  Initiated stair training this PM.  Pt did well.  Pt's family was present entire session.      Follow Up Recommendations  Home health PT;Supervision/Assistance - 24 hour     Does the patient have the potential to tolerate intense rehabilitation     Barriers to Discharge        Equipment Recommendations  None recommended by OT    Recommendations for Other Services    Frequency 7X/week   Plan Discharge plan remains appropriate    Precautions / Restrictions Precautions Precautions: Knee Restrictions LLE Weight Bearing: Weight bearing as tolerated       Mobility  Bed Mobility Bed Mobility: Supine to Sit;Sitting - Scoot to Edge of Bed Supine to Sit: HOB flat;Other (comment) (pt's son provided (A) at shoulders/trunk to sit upright) Sitting - Scoot to Edge of Bed: 4: Min guard Sit to Supine: 4: Min assist Details for Bed Mobility Assistance: Cues for UE placement & use to incresae ease of transition Transfers Transfers: Sit to Stand;Stand to Sit Sit to Stand: 4: Min guard;With armrests;From bed;From chair/3-in-1;With upper extremity assist Stand to Sit: 4: Min guard;With upper extremity assist;With armrests;To chair/3-in-1 Details for Transfer Assistance: Guarding for safety Ambulation/Gait Ambulation/Gait Assistance: 4: Min guard Ambulation Distance (Feet): 30 Feet Assistive device: Rolling walker Ambulation/Gait Assistance Details: Cues for tall posture, sequencing, body positioning inside RW.  Cont's with slow gait speed.   Gait Pattern: Step-to pattern;Decreased step length - right;Decreased step length - left;Decreased weight shift to left;Trunk  flexed Stairs: Yes Stairs Assistance: 4: Min guard Stairs Assistance Details (indicate cue type and reason): Pt performed 2 various ways: forwards with 2 rails vs sideways with 1 rail.  Cues for sequencing & technique.  Pt's son present for education.   Stair Management Technique: Two rails;One rail Left;Step to pattern;Sideways;Forwards Number of Stairs: 3  (2x's) Wheelchair Mobility Wheelchair Mobility: No      PT Goals Acute Rehab PT Goals Time For Goal Achievement: 06/28/12 Potential to Achieve Goals: Good Pt will go Supine/Side to Sit: with supervision;with HOB 0 degrees PT Goal: Supine/Side to Sit - Progress: Progressing toward goal Pt will go Sit to Supine/Side: with supervision;with HOB 0 degrees Pt will go Sit to Stand: with supervision;with upper extremity assist PT Goal: Sit to Stand - Progress: Progressing toward goal Pt will go Stand to Sit: with supervision;with upper extremity assist PT Goal: Stand to Sit - Progress: Progressing toward goal Pt will Ambulate: 51 - 150 feet;with supervision;with rolling walker PT Goal: Ambulate - Progress: Progressing toward goal Pt will Go Up / Down Stairs: 3-5 stairs;with min assist;with rail(s);with least restrictive assistive device PT Goal: Up/Down Stairs - Progress: Met Pt will Perform Home Exercise Program: with supervision, verbal cues required/provided  Visit Information  Last PT Received On: 06/23/12 Assistance Needed: +1    Subjective Data      Cognition  Overall Cognitive Status: Appears within functional limits for tasks assessed/performed Arousal/Alertness: Awake/alert Orientation Level: Appears intact for tasks assessed Behavior During Session: [facility] for tasks performed    Balance     End of Session PT - End of Session Equipment Utilized During Treatment: Gait belt Activity Tolerance: Patient tolerated treatment  well Patient left: in chair;with call bell/phone within reach;with family/visitor present Nurse  Communication: Mobility status     Verdell Face, [place] [phone] 06/23/2012

## 2012-06-23 NOTE — Discharge Summary (Signed)
[facility] Georgena Spurling, MD   Altamese Cabal, PA-C [address] [place], [place], [place]  [address]                             [phone]  PATIENT ID: [name]        MRN:  [id number]          DOB/AGE: 09-07-1930 / 78 y.o.    DISCHARGE SUMMARY  ADMISSION DATE:    06/21/2012 DISCHARGE DATE:   06/23/2012   ADMISSION DIAGNOSIS: osteoarthritis left knee    DISCHARGE DIAGNOSIS:  osteoarthritis left knee    ADDITIONAL DIAGNOSIS: Active Problems:  * No active hospital problems. *   Past Medical History  Diagnosis Date  . Myocardial infarction 1982  . Coronary artery disease   . Hypertension   . Hyperlipemia   . Chronic kidney disease     followed by dr. fox for kidney function  . GERD (gastroesophageal reflux disease)     ulcer  . Arthritis   . AAA (abdominal aortic aneurysm)     by Dr. Dulce Sellar notes, 4.3 cm by U/S 05/2012    PROCEDURE: Procedure(s): TOTAL KNEE ARTHROPLASTY on 06/21/2012  CONSULTS:     HISTORY:  See H&P in chart  HOSPITAL COURSE:  [name] is a 78 y.o. admitted on 06/21/2012 and found to have a diagnosis of osteoarthritis left knee.  After appropriate laboratory studies were obtained  they were taken to the operating room on 06/21/2012 and underwent Procedure(s): TOTAL KNEE ARTHROPLASTY.   They were given perioperative antibiotics:  Anti-infectives     Start     Dose/Rate Route Frequency Ordered Stop   06/21/12 1700   ceFAZolin (ANCEF) IVPB 2 g/50 mL premix        2 g 100 mL/hr over 30 Minutes Intravenous Every 6 hours 06/21/12 1558 07-02-2012 0235   06/20/12 1529   ceFAZolin (ANCEF) 3 g in dextrose 5 % 50 mL IVPB        3 g 160 mL/hr over 30 Minutes Intravenous 60 min pre-op 06/20/12 1529 06/21/12 1026        .  Tolerated the procedure well.  Placed with a foley intraoperatively.  Given Ofirmev at induction and for 48 hours.    POD #1, allowed out of bed to a chair.  PT for ambulation and exercise program.  Foley D/C'd in morning.   IV saline locked.  O2 discontionued.  POD #2, continued PT and ambulation.   Hemovac pulled. .  The remainder of the hospital course was dedicated to ambulation and strengthening.   The patient was discharged on 2 Days Post-Op in  Good condition.  Blood products given:none  DIAGNOSTIC STUDIES: Recent vital signs: Patient Vitals for the past 24 hrs:  BP Temp Pulse Resp SpO2  06/23/12 1231 90/53 mmHg - - - -  06/23/12 0800 93/56 mmHg - - - -  06/23/12 0657 120/57 mmHg 100.5 F (38.1 C) 70  18  92 %  06/23/12 0400 - - - 18  94 %  06/23/12 0000 - - - 18  93 %  07-02-12 2321 116/65 mmHg 99.6 F (37.6 C) 77  18  92 %  Jul 02, 2012 2000 - - - 18  98 %  2012/07/02 1400 118/55 mmHg 98.2 F (36.8 C) 63  20  97 %       Recent laboratory studies:  Basename 06/23/12 0425 July 02, 2012 0450 06/21/12 2130  WBC 13.0* 12.7* --  HGB 9.1* 10.0* 12.6*  HCT 27.9* 31.2* 37.0*  PLT 165 192 --    Basename 06/22/12 0450 06/21/12 0823  NA 136 140  K 3.9 3.6  CL 95* 100  CO2 29 --  BUN 39* 41*  CREATININE 2.19* 2.20*  GLUCOSE 127* 149*  CALCIUM 8.1* --   Lab Results  Component Value Date   INR 0.96 06/11/2012   INR 0.87 05/14/2011     Recent Radiographic Studies :  Dg Chest 2 View  06/21/2012  *RADIOLOGY REPORT*  Clinical Data: Coronary disease, hypertension, preop evaluation for knee replacement  CHEST - 2 VIEW  Comparison: 05/15/2011  Findings: Stable cardiac enlargement with normal vascularity.  No CHF, pneumonia, collapse, consolidation, effusion or pneumothorax. Atherosclerotic ectatic thoracic aorta evident.  No significant interval change.  IMPRESSION: Cardiomegaly without CHF or pneumonia.  Atherosclerotic ectatic thoracic aorta.   Original Report Authenticated By: Judie Petit. Miles Costain, M.D.     DISCHARGE INSTRUCTIONS: Discharge Orders    Future Orders Please Complete By Expires   Diet - low sodium heart healthy      Call MD / Call 911      Comments:   If you experience chest pain or shortness of  breath, CALL 911 and be transported to the hospital emergency room.  If you develope a fever above 101 F, pus (white drainage) or increased drainage or redness at the wound, or calf pain, call your surgeon's office.   Constipation Prevention      Comments:   Drink plenty of fluids.  Prune juice may be helpful.  You may use a stool softener, such as Colace (over the counter) 100 mg twice a day.  Use MiraLax (over the counter) for constipation as needed.   Increase activity slowly as tolerated      Driving restrictions      Comments:   No driving for 6 weeks   Lifting restrictions      Comments:   No lifting for 6 weeks   Do not put a pillow under the knee. Place it under the heel.      Change dressing      Comments:   Change dressing on thursday, then change the dressing daily with sterile 4 x 4 inch gauze dressing and apply TED hose.  You may clean the incision with alcohol prior to redressing.   TED hose      Comments:   Use stockings (TED hose) for 3 weeks on both leg(s).  You may remove them at night for sleeping.   CPM      Comments:   Continuous passive motion machine (CPM):      Use the CPM from 0 to 90 for 6-8 hours per day.      You may increase by 10 per day.  You may break it up into 2 or 3 sessions per day.      Use CPM for 2 weeks or until you are told to stop.      DISCHARGE MEDICATIONS:     Medication List     As of 06/23/2012  1:09 PM    STOP taking these medications         aspirin 81 MG tablet      TAKE these medications         allopurinol 300 MG tablet   Commonly known as: ZYLOPRIM   Take 300 mg by mouth daily.      atenolol 100 MG tablet  Commonly known as: TENORMIN   Take 100 mg by mouth daily.      atorvastatin 80 MG tablet   Commonly known as: LIPITOR   Take 80 mg by mouth every evening.      b complex vitamins capsule   Take 1 capsule by mouth daily.      celecoxib 200 MG capsule   Commonly known as: CELEBREX   Take 1 capsule (200 mg  total) by mouth every 12 (twelve) hours.      CENTRUM SILVER PO   Take 1 tablet by mouth daily.      enoxaparin 40 MG/0.4ML injection   Commonly known as: LOVENOX   Inject 0.4 mLs (40 mg total) into the skin every 12 (twelve) hours.      folic acid 400 MCG tablet   Commonly known as: FOLVITE   Take 400 mcg by mouth daily.      furosemide 40 MG tablet   Commonly known as: LASIX   Take 20 mg by mouth daily.      hydrochlorothiazide 25 MG tablet   Commonly known as: HYDRODIURIL   Take 25 mg by mouth daily.      isosorbide-hydrALAZINE 20-37.5 MG per tablet   Commonly known as: BIDIL   Take 1 tablet by mouth daily.      methocarbamol 500 MG tablet   Commonly known as: ROBAXIN   Take 1 tablet (500 mg total) by mouth every 6 (six) hours as needed.      omeprazole 20 MG capsule   Commonly known as: PRILOSEC   Take 20 mg by mouth daily.      oxyCODONE 5 MG immediate release tablet   Commonly known as: Oxy IR/ROXICODONE   Take 1-2 tablets (5-10 mg total) by mouth every 4 (four) hours as needed.      OxyCODONE 10 mg Tb12   Commonly known as: OXYCONTIN   Take 1 tablet (10 mg total) by mouth every 12 (twelve) hours.      ZETIA 10 MG tablet   Generic drug: ezetimibe   Take 10 mg by mouth at bedtime.        FOLLOW UP VISIT:       Follow-up Information    Follow up with Raymon Mutton, MD. Call on 06/23/2012.   Contact information:   [address] [place] [address] [phone]          DISPOSITION: home  CONDITION:  {Good  Nahun Kronberg 06/23/2012, 1:09 PM

## 2012-06-24 LAB — CBC
HCT: 26.9 % — ABNORMAL LOW (ref 39.0–52.0)
MCV: 90.9 fL (ref 78.0–100.0)
RBC: 2.96 MIL/uL — ABNORMAL LOW (ref 4.22–5.81)
RDW: 16.3 % — ABNORMAL HIGH (ref 11.5–15.5)
WBC: 14.3 10*3/uL — ABNORMAL HIGH (ref 4.0–10.5)

## 2012-06-24 NOTE — Progress Notes (Signed)
Lucendia Leard, PTA [phone] 06/24/2012  

## 2012-06-24 NOTE — Progress Notes (Signed)
Physical Therapy Treatment Patient Details Name: [name] MRN: [id number] DOB: Nov 01, 1930 Today's Date: 06/24/2012 Time: 4098-1191 PT Time Calculation (min): 27 min  PT Assessment / Plan / Recommendation Comments on Treatment Session  Pt. presents to be moving well with some increased mobility speed during todays session. Pt. able to ambulate in the hallway with min guard and times of supervision. Pt. performing exercises with no-min VC from SPTA to technique.     Follow Up Recommendations  Home health PT;Supervision/Assistance - 24 hour     Does the patient have the potential to tolerate intense rehabilitation     Barriers to Discharge        Equipment Recommendations  None recommended by OT    Recommendations for Other Services    Frequency 7X/week   Plan Discharge plan remains appropriate    Precautions / Restrictions Precautions Precautions: Knee Restrictions Weight Bearing Restrictions: Yes LLE Weight Bearing: Weight bearing as tolerated   Pertinent Vitals/Pain Patient reports some pain in his left knee, 2/10.    Mobility  Bed Mobility Sit to Supine: 4: Min assist Details for Bed Mobility Assistance: Min (A) for Lt. LE into bed. Educated on hook method and attempted, SPTA still providing support.  Transfers Transfers: Sit to Stand;Stand to Sit Sit to Stand: 4: Min guard;With upper extremity assist;With armrests;From chair/3-in-1 Stand to Sit: 4: Min guard;With upper extremity assist;With armrests;To bed;To chair/3-in-1 Details for Transfer Assistance: Pt. min guard for sit<>stands for safety and steadyness. Pt. presents more unsteady with initial sit>stand and became more steady as session continued. VC for proper hand placement when stepping to or sitting from RW. Ambulation/Gait Ambulation/Gait Assistance: 4: Min guard;5: Supervision Ambulation Distance (Feet): 100 Feet Assistive device: Rolling walker Ambulation/Gait Assistance Details: Pt. given VC for  tall posture and to straighten his Lt. LE with gait. Pt. with slow, step-to gt. during todays session. Min guard for safety with times of supervision once pt. walked a few feet.  Gait Pattern: Step-to pattern;Decreased stride length Gait velocity: decreased Stairs: No Wheelchair Mobility Wheelchair Mobility: No    Exercises Total Joint Exercises Ankle Circles/Pumps: AROM;Both;10 reps;Seated Quad Sets: AROM;Left;10 reps;Seated Heel Slides: AAROM;Left;10 reps;Seated Straight Leg Raises: AAROM;Left;5 reps;Seated Long Arc Quad: AROM;Left;10 reps;Seated    PT Goals Acute Rehab PT Goals PT Goal Formulation: With patient Time For Goal Achievement: 06/28/12 Potential to Achieve Goals: Good Pt will go Supine/Side to Sit: with supervision;with HOB 0 degrees Pt will go Sit to Supine/Side: with supervision;with HOB 0 degrees PT Goal: Sit to Supine/Side - Progress: Progressing toward goal Pt will go Sit to Stand: with supervision;with upper extremity assist PT Goal: Sit to Stand - Progress: Progressing toward goal Pt will go Stand to Sit: with supervision;with upper extremity assist PT Goal: Stand to Sit - Progress: Progressing toward goal Pt will Ambulate: 51 - 150 feet;with supervision;with rolling walker PT Goal: Ambulate - Progress: Progressing toward goal Pt will Go Up / Down Stairs: 3-5 stairs;with min assist;with rail(s);with least restrictive assistive device Pt will Perform Home Exercise Program: with supervision, verbal cues required/provided PT Goal: Perform Home Exercise Program - Progress: Progressing toward goal  Visit Information  Last PT Received On: 06/24/12 Assistance Needed: +1    Subjective Data  Subjective: "I have some indigestion going on" Patient Stated Goal: To return home with wife   Cognition  Overall Cognitive Status: Appears within functional limits for tasks assessed/performed Arousal/Alertness: Awake/alert Orientation Level: Appears intact for tasks  assessed Behavior During Session: [facility] for tasks  performed    Balance     End of Session PT - End of Session Equipment Utilized During Treatment: Gait belt Activity Tolerance: Patient tolerated treatment well Patient left: in chair;with call bell/phone within reach Nurse Communication: Mobility status;Other (comment) (Pt. feeling nauseaus/has indigestion)    Yesha Muchow, SPTA 06/24/2012, 11:21 AM

## 2012-10-22 IMAGING — CR DG CHEST 2V
2 series · 2 of 2 positions shown · non-contrast
Comparison: 11/09/2010

CLINICAL DATA: Sudden onset chest pain.

CHEST - 2 VIEW

[w chest pa]
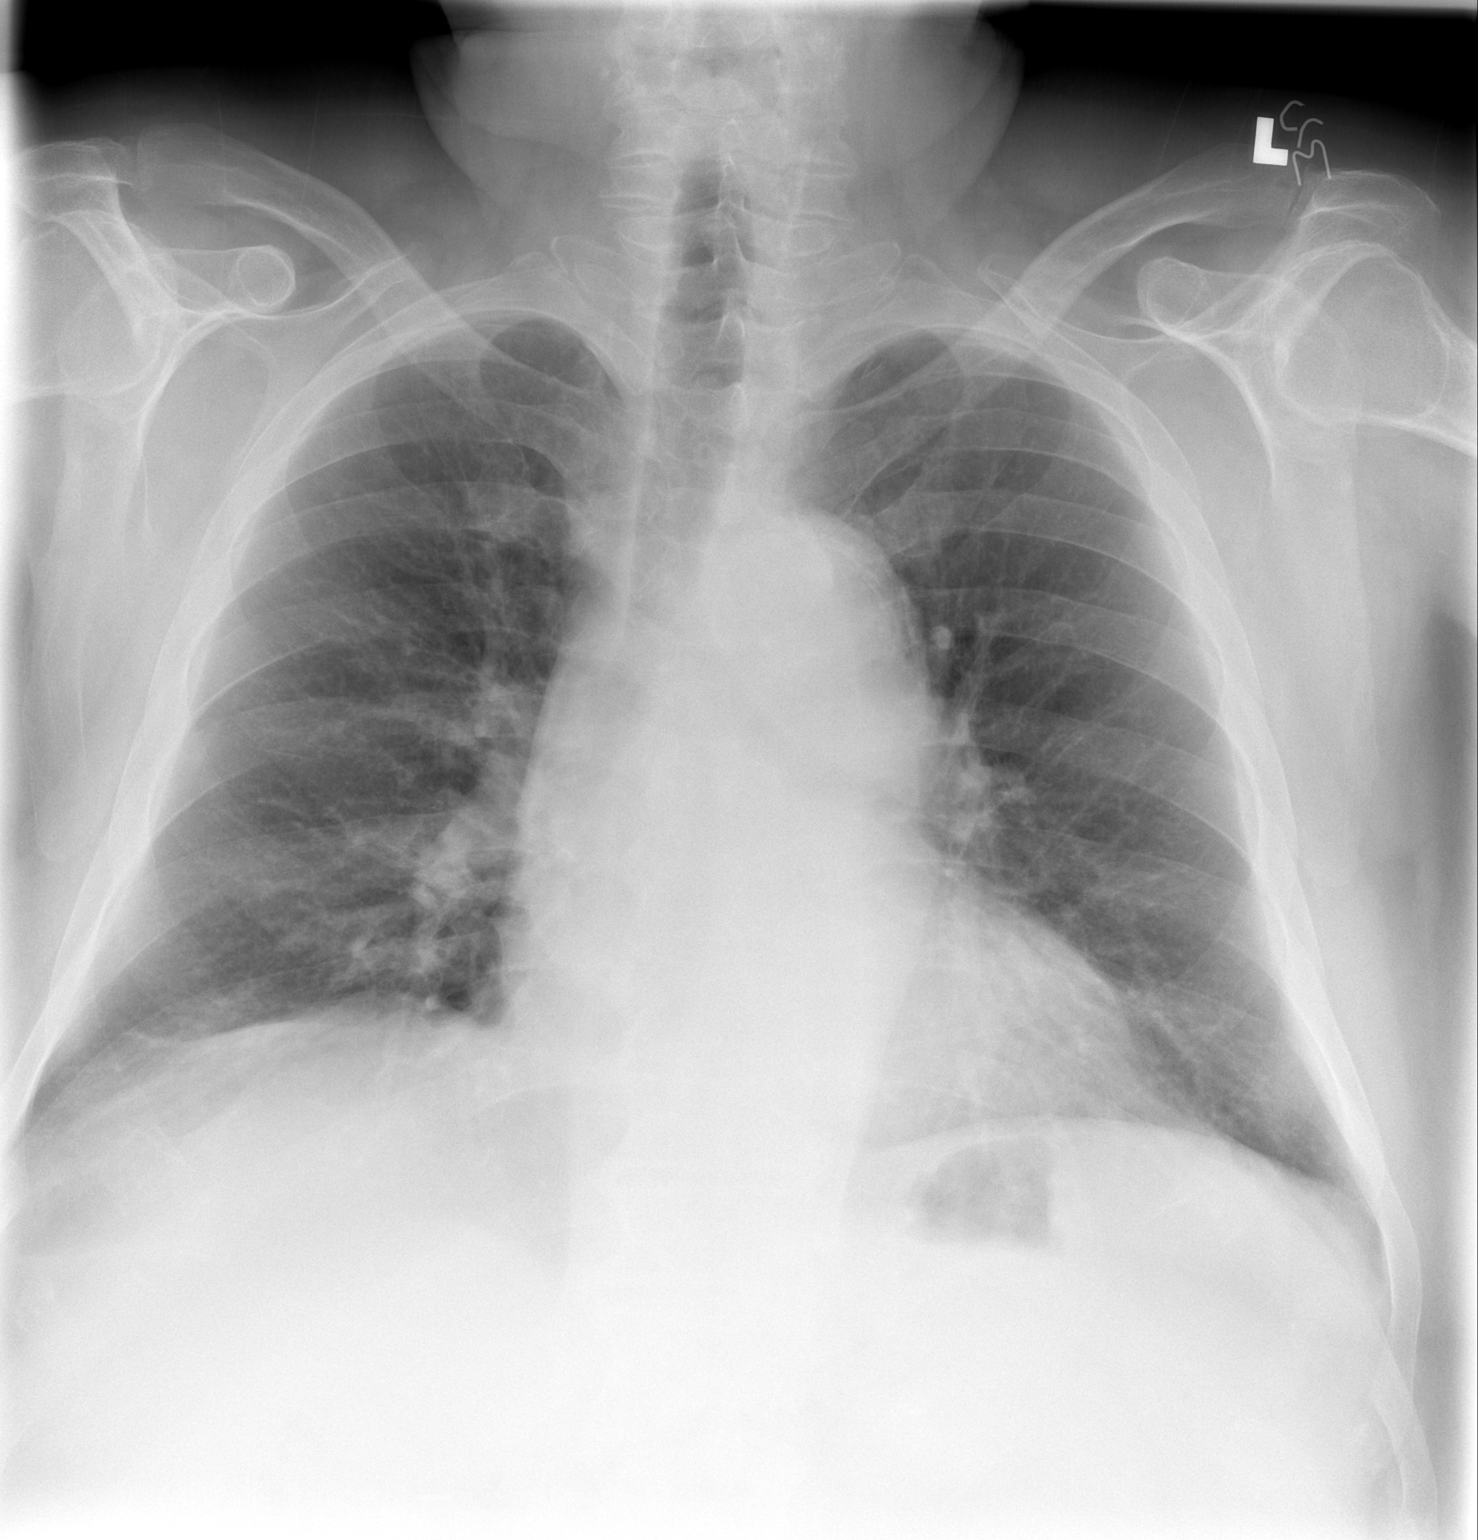

[w chest lat]
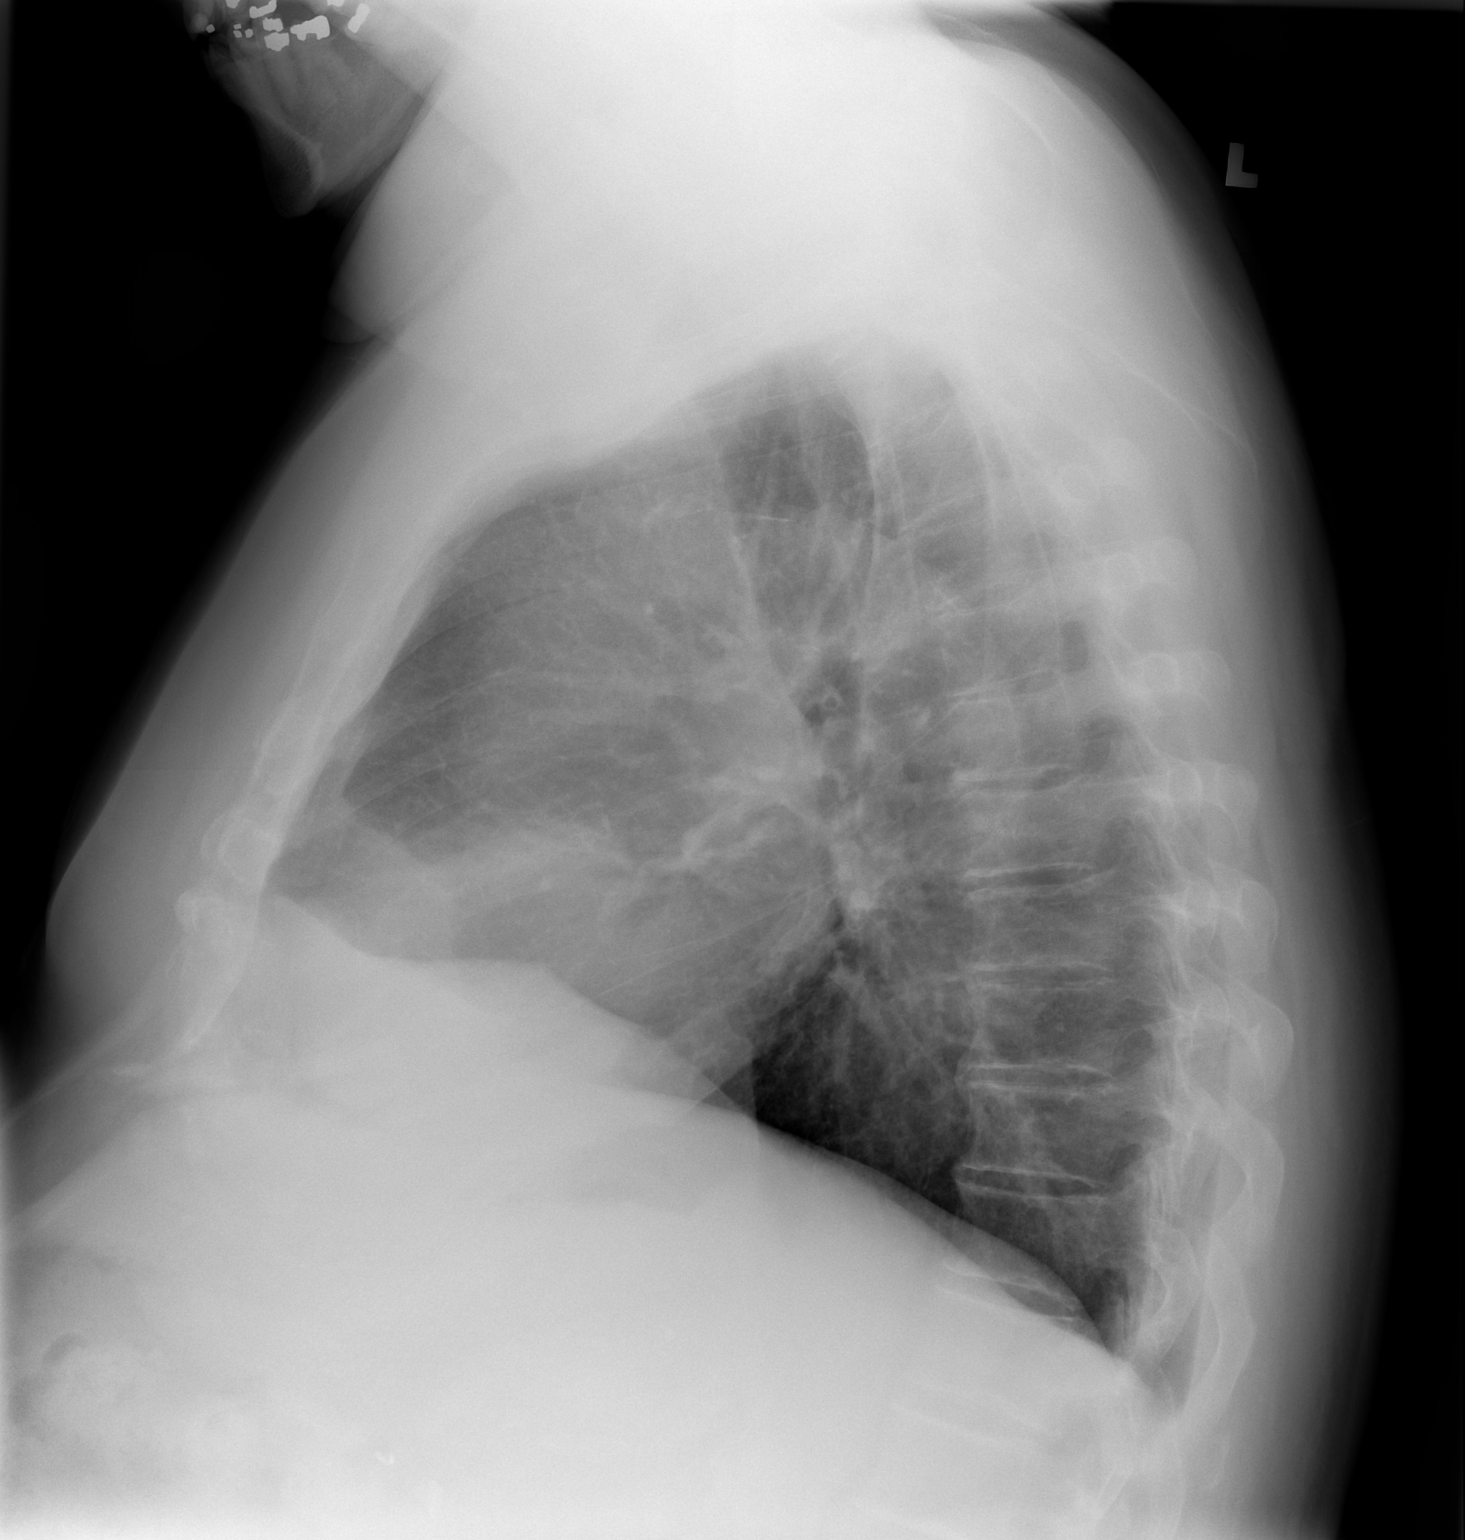

[2 of 2 positions shown; findings below may reference images not displayed]

FINDINGS: Mild right lower lobe scarring versus atelectasis.
Cardiomediastinal contours are mildly prominent, similar to prior.
Mild tortuosity and ectasia of the thoracic aorta with scattered
atherosclerotic calcification. No pleural effusion or pneumothorax.
No acute osseous abnormality.
IMPRESSION: Mild right lower lobe opacity is likely scarring or atelectasis.

Mild tortuosity and ectasia of the thoracic aorta with scattered
atherosclerotic calcification.

## 2012-10-23 IMAGING — CR DG CHEST 2V
2 series · 2 of 2 positions shown · non-contrast
Comparison: 05/14/2011

CLINICAL DATA: Chest pain

CHEST - 2 VIEW

[w chest lat]
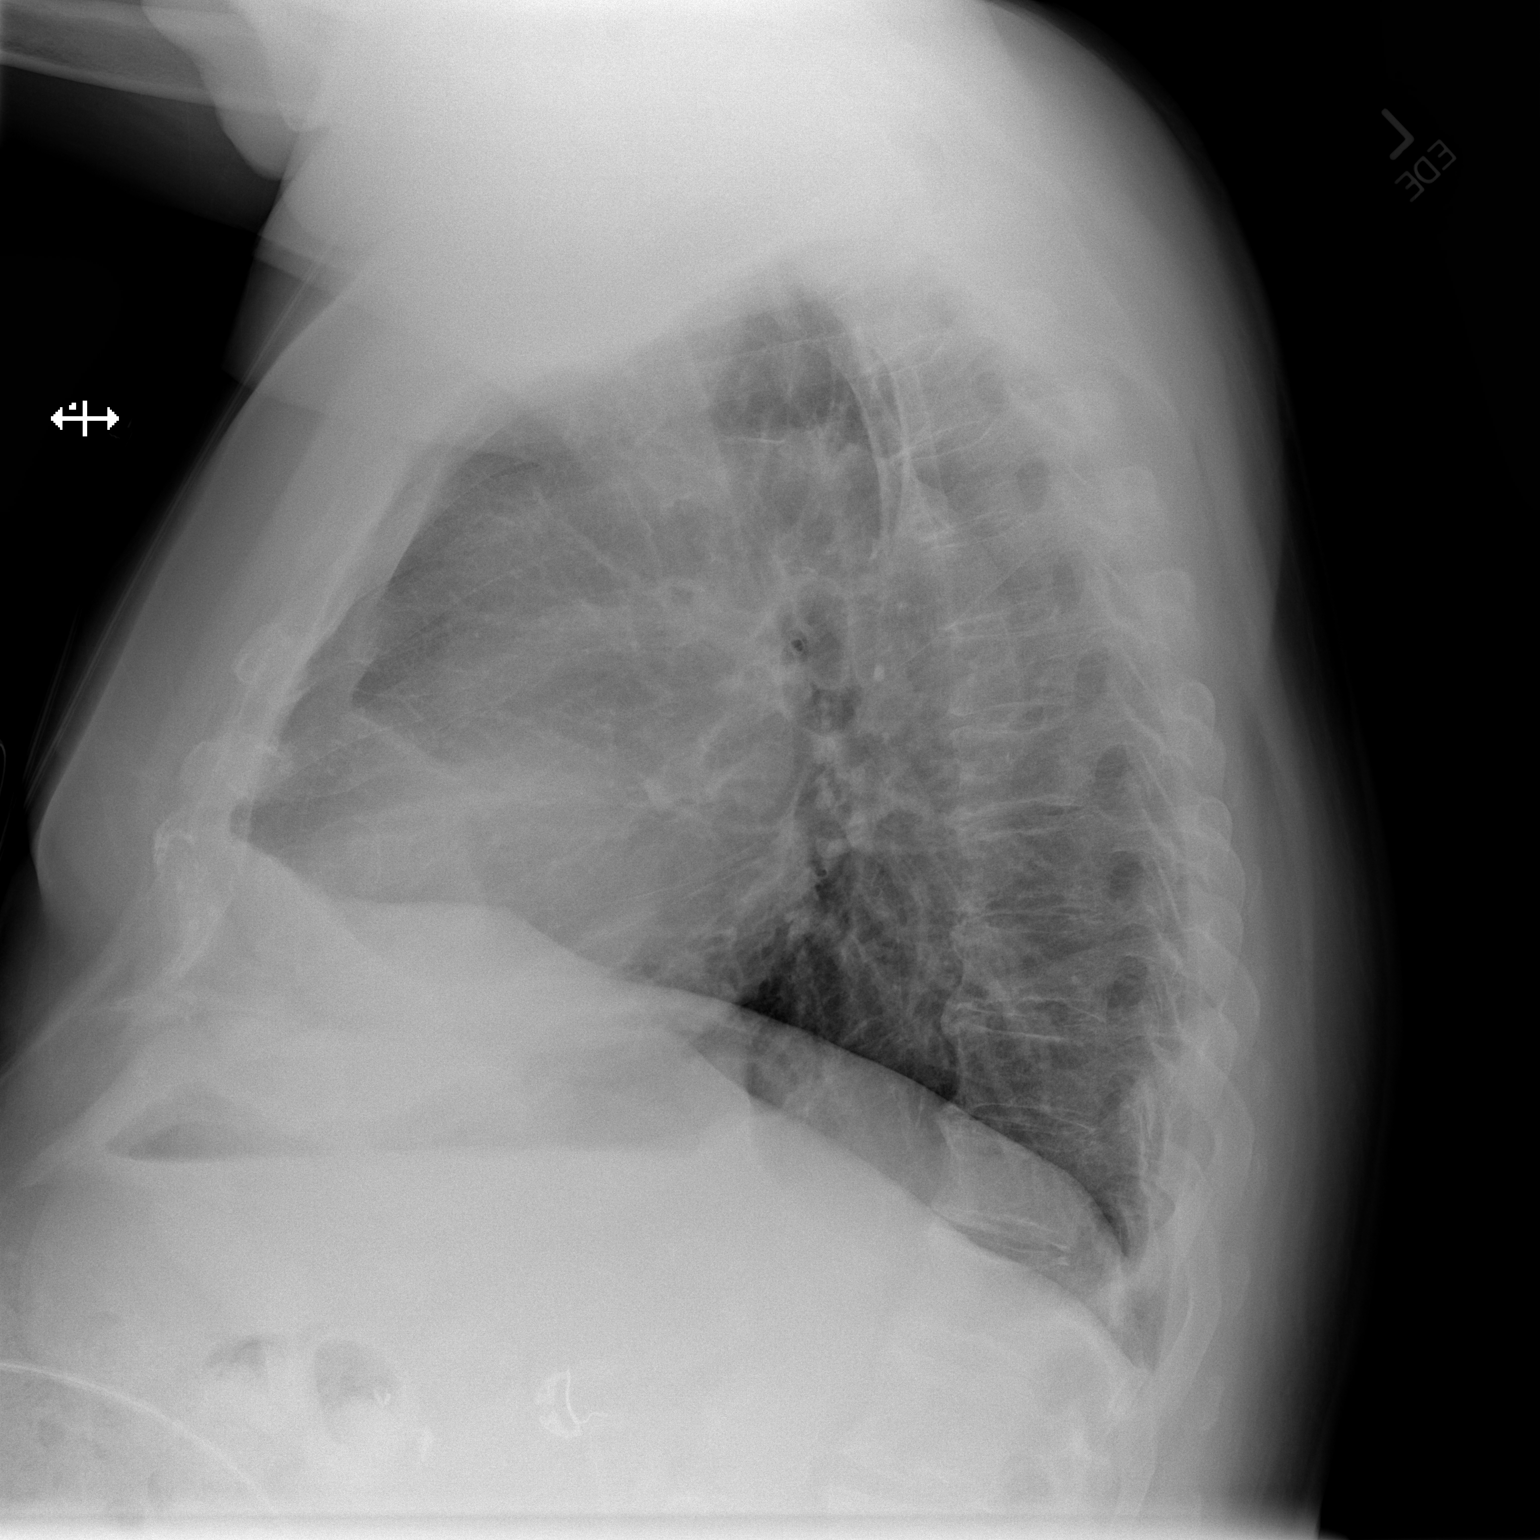

[w chest pa]
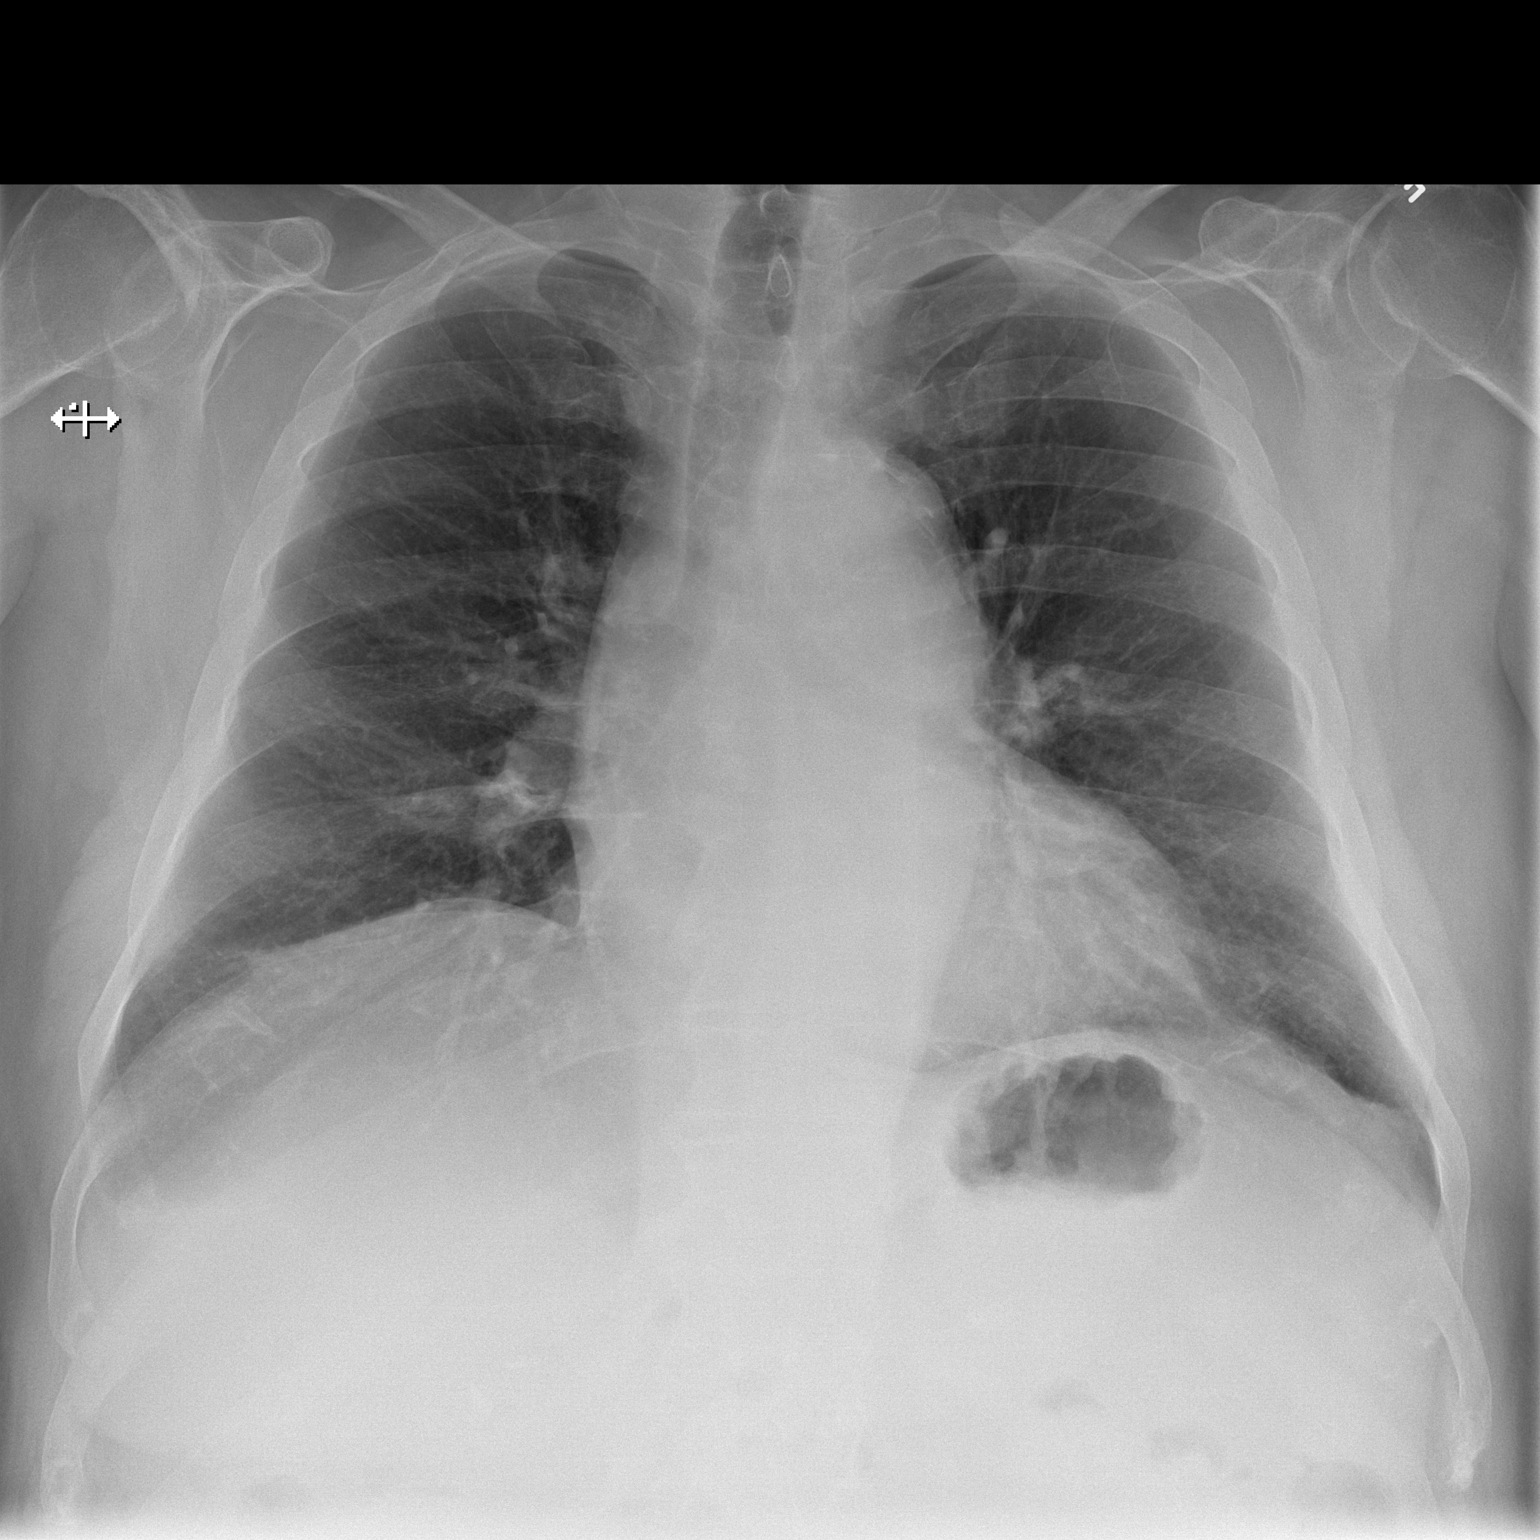

[2 of 2 positions shown; findings below may reference images not displayed]

FINDINGS: The cardiac shadow is stable.  The lungs are well aerated
with evidence of focal infiltrate.  No sizable effusion is noted.
Mild degenerative changes of the thoracic spine are again seen.
Mild ectasia of the aorta is again noted.
IMPRESSION: No acute abnormality is noted.  Previously seen changes in the
right base have resolved in the interval.

## 2012-12-22 ENCOUNTER — Other Ambulatory Visit: Payer: Self-pay | Admitting: Orthopedic Surgery

## 2012-12-22 NOTE — Progress Notes (Signed)
Left message with office and requested that orders be placed in computer.

## 2012-12-23 ENCOUNTER — Encounter (HOSPITAL_COMMUNITY): Payer: Self-pay

## 2012-12-23 ENCOUNTER — Encounter (HOSPITAL_COMMUNITY)
Admission: RE | Admit: 2012-12-23 | Discharge: 2012-12-23 | Disposition: A | Discharge status: Home / Self Care | Payer: Medicare Other | Source: Ambulatory Visit | Attending: Orthopedic Surgery | Admitting: Orthopedic Surgery

## 2012-12-23 LAB — URINALYSIS, ROUTINE W REFLEX MICROSCOPIC
Bilirubin Urine: NEGATIVE
Glucose, UA: NEGATIVE mg/dL
Hgb urine dipstick: NEGATIVE
Specific Gravity, Urine: 1.014 (ref 1.005–1.030)

## 2012-12-23 LAB — CBC WITH DIFFERENTIAL/PLATELET
Eosinophils Absolute: 0.1 10*3/uL (ref 0.0–0.7)
Hemoglobin: 12.5 g/dL — ABNORMAL LOW (ref 13.0–17.0)
Lymphocytes Relative: 26 % (ref 12–46)
Lymphs Abs: 2.7 10*3/uL (ref 0.7–4.0)
MCH: 31.3 pg (ref 26.0–34.0)
Monocytes Relative: 9 % (ref 3–12)
Neutro Abs: 6.5 10*3/uL (ref 1.7–7.7)
Neutrophils Relative %: 63 % (ref 43–77)
Platelets: 207 10*3/uL (ref 150–400)
RBC: 4 MIL/uL — ABNORMAL LOW (ref 4.22–5.81)
WBC: 10.2 10*3/uL (ref 4.0–10.5)

## 2012-12-23 LAB — SURGICAL PCR SCREEN: Staphylococcus aureus: NEGATIVE

## 2012-12-23 LAB — COMPREHENSIVE METABOLIC PANEL
ALT: 13 U/L (ref 0–53)
Albumin: 3.4 g/dL — ABNORMAL LOW (ref 3.5–5.2)
Alkaline Phosphatase: 94 U/L (ref 39–117)
Chloride: 100 mEq/L (ref 96–112)
GFR calc Af Amer: 34 mL/min — ABNORMAL LOW (ref >90)
Glucose, Bld: 136 mg/dL — ABNORMAL HIGH (ref 70–99)
Potassium: 4.2 mEq/L (ref 3.5–5.1)
Sodium: 139 mEq/L (ref 135–145)
Total Bilirubin: 0.6 mg/dL (ref 0.3–1.2)
Total Protein: 6.7 g/dL (ref 6.0–8.3)

## 2012-12-23 LAB — APTT: aPTT: 30 seconds (ref 24–37)

## 2012-12-23 NOTE — Pre-Procedure Instructions (Signed)
[name]  12/23/2012   Your procedure is scheduled on:  June 2  Report to [facility] at 06:30 AM.  Call this number if you have problems the morning of surgery: [phone]   Remember:   Do not eat food or drink liquids after midnight.   Take these medicines the morning of surgery with A SIP OF WATER: Atenolol, Isosorbide, Omeprazole, Oxycodone,    STOP Multiple Vitamins, folic acid, aspirin, Vitamin B, celebrex, Integra F on May 27  Do not wear jewelry, make-up or nail polish.  Do not wear lotions, powders, or perfumes. You may wear deodorant.  Do not shave 48 hours prior to surgery. Men may shave face and neck.  Do not bring valuables to the hospital.  Contacts, dentures or bridgework may not be worn into surgery.  Leave suitcase in the car. After surgery it may be brought to your room.  For patients admitted to the hospital, checkout time is 11:00 AM the day of  discharge.   Special Instructions: Shower using CHG 2 nights before surgery and the night before surgery.  If you shower the day of surgery use CHG.  Use special wash - you have one bottle of CHG for all showers.  You should use approximately 1/3 of the bottle for each shower.   Please read over the following fact sheets that you were given: Pain Booklet, Coughing and Deep Breathing, Blood Transfusion Information, Total Joint Packet and Surgical Site Infection Prevention

## 2012-12-23 NOTE — Progress Notes (Signed)
12/23/12 0829  OBSTRUCTIVE SLEEP APNEA  Have you ever been diagnosed with sleep apnea through a sleep study? No  Do you snore loudly (loud enough to be heard through closed doors)?  0  Do you often feel tired, fatigued, or sleepy during the daytime? 0  Has anyone observed you stop breathing during your sleep? 0  Do you have, or are you being treated for high blood pressure? 1  BMI more than 35 kg/m2? 1  Age over 77 years old? 1  Neck circumference greater than 40 cm/18 inches? 1  Gender: 1  Obstructive Sleep Apnea Score 5  Score 4 or greater  Results sent to PCP

## 2012-12-24 LAB — URINE CULTURE
Colony Count: NO GROWTH
Culture: NO GROWTH

## 2012-12-24 NOTE — Progress Notes (Addendum)
Anesthesia Chart Review: Patient is an 79 year old male scheduled for right TKA by Dr. Sherlean Foot on 01/03/13. He is s/p left TKA on 06/21/12.  Other history includes CAD/inferior MI, HLD, HTN, obesity, GERD, CKD stage III, AAA (4.3 cm by ultrasound 05/2012 by cardiology notes), non-smoker. PCP is Dr. Aida Puffer @ [facility]. Nephrologist is Dr. Caryn Section.   Cardiologist is Dr. Dulce Sellar at [facility]) in [place]. Patient reported that he is scheduled for a visit next week.  He was previously seen on 05/17/12 for a preoperative evaluation prior to his left TKA in November 2013.    EKG on 12/23/12 showed SB, first degree AVB, occasional PVCs, LAD, non-specific intra-ventricular conduction block, inferior infarct (age undetermined), possible anterolateral infarct (age undetermined). (His PAT RN already faxed a copy of his EKG to [facility].)  Nuclear stress test on 05/19/12 [facility]) showed no evidence of ischemia, fixed defect noted in the inferior wall of the LV suggesting scar from previous MI, diminished EF calculated at 37% (visually 40%) with inferior akinesis.   Echo on 01/15/12 [facility]) showed mild concentric LVH, moderate LA enlargement, mild LV systolic dysfunction, EF 40-45%, aortic valve sclerosis with trivial regurgitation, mild aneurysmal dilation of the ascending aorta (aortic root 28 mm).   CXR on 06/22/12 showed: Cardiomegaly without CHF or pneumonia. Atherosclerotic ectatic thoracic aorta.   Preoperative labs noted. Cr was 2.02 (was 1.76 - 2.36 since 01/02/12 according to comparison labs in Epic and those received from Dr. Clarene Duke and Dr. Dulce Sellar in November 2013). Glucose is 136.  H/H 12.5/36.4.  Urine showed no growth.  T&S done.  I will follow-up once additional cardiology notes available from his appointment next week.  Velna Ochs [facility] Short Stay Center/Anesthesiology Phone [phone] 12/24/2012 2:57 PM  Addendum: 12/28/12 1135 I reviewed his cardiology notes from  Dr. Dulce Sellar on 12/27/12.  His note mentions to continue current treatment including beta blocker therapy on the morning on surgery.  No additional cardiac testing was ordered.

## 2012-12-28 NOTE — Progress Notes (Signed)
Pt seen by [facility] in [place] yesterday.  He did not have any testing done.  Office to fax OV note now.

## 2013-01-02 MED ORDER — DEXTROSE 5 % IV SOLN
3.0000 g | INTRAVENOUS | Status: AC
  Administered 2013-01-03: 3 g via INTRAVENOUS
  Filled 2013-01-02: qty 3000

## 2013-01-03 ENCOUNTER — Encounter (HOSPITAL_COMMUNITY): Payer: Self-pay | Admitting: Vascular Surgery

## 2013-01-03 ENCOUNTER — Inpatient Hospital Stay (HOSPITAL_COMMUNITY): Payer: Medicare Other | Admitting: Anesthesiology

## 2013-01-03 ENCOUNTER — Inpatient Hospital Stay (HOSPITAL_COMMUNITY)
Admission: RE | Admit: 2013-01-03 | Discharge: 2013-01-05 | DRG: 470 | Disposition: A | Discharge status: Home Health | Payer: Medicare Other | Source: Ambulatory Visit | Attending: Orthopedic Surgery | Admitting: Orthopedic Surgery

## 2013-01-03 ENCOUNTER — Encounter (HOSPITAL_COMMUNITY)
Admission: RE | Disposition: A | Discharge status: Home Health | Payer: Self-pay | Source: Ambulatory Visit | Attending: Orthopedic Surgery

## 2013-01-03 ENCOUNTER — Encounter (HOSPITAL_COMMUNITY): Payer: Self-pay | Admitting: *Deleted

## 2013-01-03 DIAGNOSIS — Z7982 Long term (current) use of aspirin: Secondary | ICD-10-CM

## 2013-01-03 DIAGNOSIS — Z01810 Encounter for preprocedural cardiovascular examination: Secondary | ICD-10-CM

## 2013-01-03 DIAGNOSIS — Z01812 Encounter for preprocedural laboratory examination: Secondary | ICD-10-CM

## 2013-01-03 DIAGNOSIS — S68118A Complete traumatic metacarpophalangeal amputation of other finger, initial encounter: Secondary | ICD-10-CM

## 2013-01-03 DIAGNOSIS — D62 Acute posthemorrhagic anemia: Secondary | ICD-10-CM | POA: Diagnosis not present

## 2013-01-03 DIAGNOSIS — M1710 Unilateral primary osteoarthritis, unspecified knee: Principal | ICD-10-CM | POA: Diagnosis present

## 2013-01-03 DIAGNOSIS — Z79899 Other long term (current) drug therapy: Secondary | ICD-10-CM

## 2013-01-03 DIAGNOSIS — E785 Hyperlipidemia, unspecified: Secondary | ICD-10-CM | POA: Diagnosis present

## 2013-01-03 DIAGNOSIS — K219 Gastro-esophageal reflux disease without esophagitis: Secondary | ICD-10-CM | POA: Diagnosis present

## 2013-01-03 DIAGNOSIS — I129 Hypertensive chronic kidney disease with stage 1 through stage 4 chronic kidney disease, or unspecified chronic kidney disease: Secondary | ICD-10-CM | POA: Diagnosis present

## 2013-01-03 DIAGNOSIS — I739 Peripheral vascular disease, unspecified: Secondary | ICD-10-CM | POA: Diagnosis present

## 2013-01-03 DIAGNOSIS — I252 Old myocardial infarction: Secondary | ICD-10-CM

## 2013-01-03 DIAGNOSIS — Z96659 Presence of unspecified artificial knee joint: Secondary | ICD-10-CM

## 2013-01-03 DIAGNOSIS — I714 Abdominal aortic aneurysm, without rupture, unspecified: Secondary | ICD-10-CM | POA: Diagnosis present

## 2013-01-03 DIAGNOSIS — N189 Chronic kidney disease, unspecified: Secondary | ICD-10-CM | POA: Diagnosis present

## 2013-01-03 DIAGNOSIS — Z96651 Presence of right artificial knee joint: Secondary | ICD-10-CM

## 2013-01-03 DIAGNOSIS — I2510 Atherosclerotic heart disease of native coronary artery without angina pectoris: Secondary | ICD-10-CM | POA: Diagnosis present

## 2013-01-03 HISTORY — PX: TOTAL KNEE ARTHROPLASTY: SHX125

## 2013-01-03 LAB — CBC
MCV: 92.5 fL (ref 78.0–100.0)
Platelets: 191 10*3/uL (ref 150–400)
RBC: 3.86 MIL/uL — ABNORMAL LOW (ref 4.22–5.81)
WBC: 11.8 10*3/uL — ABNORMAL HIGH (ref 4.0–10.5)

## 2013-01-03 LAB — CREATININE, SERUM
GFR calc Af Amer: 31 mL/min — ABNORMAL LOW (ref >90)
GFR calc non Af Amer: 27 mL/min — ABNORMAL LOW (ref >90)

## 2013-01-03 SURGERY — ARTHROPLASTY, KNEE, TOTAL
Anesthesia: General | Site: Knee | Laterality: Right | Wound class: Clean

## 2013-01-03 MED ORDER — BUPIVACAINE LIPOSOME 1.3 % IJ SUSP
20.0000 mL | INTRAMUSCULAR | Status: AC
  Administered 2013-01-03: 20 mL
  Filled 2013-01-03: qty 20

## 2013-01-03 MED ORDER — ALUM & MAG HYDROXIDE-SIMETH 200-200-20 MG/5ML PO SUSP: 30.0000 mL | ORAL | Status: DC | PRN

## 2013-01-03 MED ORDER — ACETAMINOPHEN 325 MG PO TABS: 650.0000 mg | ORAL_TABLET | Freq: Four times a day (QID) | ORAL | Status: DC | PRN

## 2013-01-03 MED ORDER — LIDOCAINE HCL (CARDIAC) 20 MG/ML IV SOLN
INTRAVENOUS | Status: DC | PRN
  Administered 2013-01-03: 50 mg via INTRAVENOUS

## 2013-01-03 MED ORDER — DEXMEDETOMIDINE HCL IN NACL 200 MCG/50ML IV SOLN
0.4000 ug/kg/h | Freq: Once | INTRAVENOUS | Status: DC
  Filled 2013-01-03: qty 50

## 2013-01-03 MED ORDER — ACETAMINOPHEN 10 MG/ML IV SOLN
INTRAVENOUS | Status: AC
  Filled 2013-01-03: qty 100

## 2013-01-03 MED ORDER — ALLOPURINOL 100 MG PO TABS
100.0000 mg | ORAL_TABLET | Freq: Two times a day (BID) | ORAL | Status: DC
  Administered 2013-01-03 – 2013-01-05 (×4): 100 mg via ORAL
  Filled 2013-01-03 (×7): qty 1

## 2013-01-03 MED ORDER — ONDANSETRON HCL 4 MG/2ML IJ SOLN: 4.0000 mg | Freq: Once | INTRAMUSCULAR | Status: DC | PRN

## 2013-01-03 MED ORDER — HYDROCHLOROTHIAZIDE 25 MG PO TABS
25.0000 mg | ORAL_TABLET | Freq: Every day | ORAL | Status: DC
  Administered 2013-01-03 – 2013-01-05 (×3): 25 mg via ORAL
  Filled 2013-01-03 (×3): qty 1

## 2013-01-03 MED ORDER — BISACODYL 5 MG PO TBEC: 5.0000 mg | DELAYED_RELEASE_TABLET | Freq: Every day | ORAL | Status: DC | PRN

## 2013-01-03 MED ORDER — ATORVASTATIN CALCIUM 80 MG PO TABS
80.0000 mg | ORAL_TABLET | Freq: Every evening | ORAL | Status: DC
  Administered 2013-01-03 – 2013-01-04 (×2): 80 mg via ORAL
  Filled 2013-01-03 (×4): qty 1

## 2013-01-03 MED ORDER — ONDANSETRON HCL 4 MG/2ML IJ SOLN
4.0000 mg | Freq: Four times a day (QID) | INTRAMUSCULAR | Status: DC | PRN
  Administered 2013-01-03: 4 mg via INTRAVENOUS
  Filled 2013-01-03: qty 2

## 2013-01-03 MED ORDER — SODIUM CHLORIDE 0.9 % IJ SOLN
INTRAMUSCULAR | Status: AC
  Filled 2013-01-03: qty 12

## 2013-01-03 MED ORDER — PHENOL 1.4 % MT LIQD: 1.0000 | OROMUCOSAL | Status: DC | PRN

## 2013-01-03 MED ORDER — CHLORHEXIDINE GLUCONATE 4 % EX LIQD: 60.0000 mL | Freq: Once | CUTANEOUS | Status: DC

## 2013-01-03 MED ORDER — ACETAMINOPHEN 650 MG RE SUPP: 650.0000 mg | Freq: Four times a day (QID) | RECTAL | Status: DC | PRN

## 2013-01-03 MED ORDER — ONDANSETRON HCL 4 MG/2ML IJ SOLN
INTRAMUSCULAR | Status: DC | PRN
  Administered 2013-01-03: 4 mg via INTRAVENOUS

## 2013-01-03 MED ORDER — EPHEDRINE SULFATE 50 MG/ML IJ SOLN
INTRAMUSCULAR | Status: DC | PRN
  Administered 2013-01-03 (×3): 10 mg via INTRAVENOUS
  Administered 2013-01-03: 5 mg via INTRAVENOUS
  Administered 2013-01-03: 10 mg via INTRAVENOUS

## 2013-01-03 MED ORDER — PANTOPRAZOLE SODIUM 40 MG PO TBEC
40.0000 mg | DELAYED_RELEASE_TABLET | Freq: Every day | ORAL | Status: DC
  Administered 2013-01-04 – 2013-01-05 (×2): 40 mg via ORAL
  Filled 2013-01-03 (×3): qty 1

## 2013-01-03 MED ORDER — PROPOFOL 10 MG/ML IV BOLUS
INTRAVENOUS | Status: DC | PRN
  Administered 2013-01-03: 160 mg via INTRAVENOUS

## 2013-01-03 MED ORDER — FENTANYL CITRATE 0.05 MG/ML IJ SOLN
INTRAMUSCULAR | Status: DC | PRN
  Administered 2013-01-03 (×3): 50 ug via INTRAVENOUS
  Administered 2013-01-03: 100 ug via INTRAVENOUS

## 2013-01-03 MED ORDER — ENOXAPARIN SODIUM 30 MG/0.3ML ~~LOC~~ SOLN
30.0000 mg | Freq: Two times a day (BID) | SUBCUTANEOUS | Status: DC
  Administered 2013-01-04 – 2013-01-05 (×3): 30 mg via SUBCUTANEOUS
  Filled 2013-01-03 (×6): qty 0.3

## 2013-01-03 MED ORDER — HYDROMORPHONE HCL PF 1 MG/ML IJ SOLN: 0.2500 mg | INTRAMUSCULAR | Status: DC | PRN

## 2013-01-03 MED ORDER — ZOLPIDEM TARTRATE 5 MG PO TABS: 5.0000 mg | ORAL_TABLET | Freq: Every evening | ORAL | Status: DC | PRN

## 2013-01-03 MED ORDER — ISOSORB DINITRATE-HYDRALAZINE 20-37.5 MG PO TABS
1.0000 | ORAL_TABLET | Freq: Every day | ORAL | Status: DC
  Administered 2013-01-04 – 2013-01-05 (×2): 1 via ORAL
  Filled 2013-01-03 (×2): qty 1

## 2013-01-03 MED ORDER — HYDROMORPHONE HCL PF 1 MG/ML IJ SOLN: 1.0000 mg | INTRAMUSCULAR | Status: DC | PRN

## 2013-01-03 MED ORDER — LACTATED RINGERS IV SOLN
INTRAVENOUS | Status: DC | PRN
  Administered 2013-01-03 (×2): via INTRAVENOUS

## 2013-01-03 MED ORDER — CEFAZOLIN SODIUM-DEXTROSE 2-3 GM-% IV SOLR
2.0000 g | Freq: Four times a day (QID) | INTRAVENOUS | Status: AC
  Administered 2013-01-03 (×2): 2 g via INTRAVENOUS
  Filled 2013-01-03 (×2): qty 50

## 2013-01-03 MED ORDER — METOCLOPRAMIDE HCL 5 MG/ML IJ SOLN: 5.0000 mg | Freq: Three times a day (TID) | INTRAMUSCULAR | Status: DC | PRN

## 2013-01-03 MED ORDER — SODIUM CHLORIDE 0.9 % IR SOLN
Status: DC | PRN
  Administered 2013-01-03: 4000 mL

## 2013-01-03 MED ORDER — PHENYLEPHRINE HCL 10 MG/ML IJ SOLN
INTRAMUSCULAR | Status: DC | PRN
  Administered 2013-01-03 (×3): 80 ug via INTRAVENOUS

## 2013-01-03 MED ORDER — OXYCODONE HCL ER 10 MG PO T12A
20.0000 mg | EXTENDED_RELEASE_TABLET | Freq: Two times a day (BID) | ORAL | Status: DC
  Administered 2013-01-03 – 2013-01-05 (×4): 20 mg via ORAL
  Filled 2013-01-03 (×4): qty 2

## 2013-01-03 MED ORDER — METHOCARBAMOL 100 MG/ML IJ SOLN
500.0000 mg | Freq: Four times a day (QID) | INTRAVENOUS | Status: DC | PRN
  Filled 2013-01-03: qty 5

## 2013-01-03 MED ORDER — SODIUM CHLORIDE 0.9 % IJ SOLN
INTRAMUSCULAR | Status: DC | PRN
  Administered 2013-01-03: 40 mL via INTRAVENOUS

## 2013-01-03 MED ORDER — DIPHENHYDRAMINE HCL 12.5 MG/5ML PO ELIX: 12.5000 mg | ORAL_SOLUTION | ORAL | Status: DC | PRN

## 2013-01-03 MED ORDER — SODIUM CHLORIDE 0.9 % IV SOLN: INTRAVENOUS | Status: DC

## 2013-01-03 MED ORDER — ACETAMINOPHEN 10 MG/ML IV SOLN: 1000.0000 mg | Freq: Once | INTRAVENOUS | Status: DC | PRN

## 2013-01-03 MED ORDER — DEXMEDETOMIDINE HCL IN NACL 200 MCG/50ML IV SOLN
INTRAVENOUS | Status: DC | PRN
  Administered 2013-01-03: 0.5 ug/kg/h via INTRAVENOUS

## 2013-01-03 MED ORDER — EZETIMIBE 10 MG PO TABS
10.0000 mg | ORAL_TABLET | Freq: Every day | ORAL | Status: DC
  Administered 2013-01-03 – 2013-01-04 (×2): 10 mg via ORAL
  Filled 2013-01-03 (×3): qty 1

## 2013-01-03 MED ORDER — ACETAMINOPHEN 10 MG/ML IV SOLN
1000.0000 mg | Freq: Once | INTRAVENOUS | Status: AC
  Administered 2013-01-03: 1000 mg via INTRAVENOUS

## 2013-01-03 MED ORDER — BUPIVACAINE HCL 0.5 % IJ SOLN
INTRAMUSCULAR | Status: DC | PRN
  Administered 2013-01-03: 20 mL

## 2013-01-03 MED ORDER — ATENOLOL 50 MG PO TABS
50.0000 mg | ORAL_TABLET | Freq: Every day | ORAL | Status: DC
  Administered 2013-01-04 – 2013-01-05 (×2): 50 mg via ORAL
  Filled 2013-01-03 (×2): qty 1

## 2013-01-03 MED ORDER — TRANEXAMIC ACID 100 MG/ML IV SOLN
1000.0000 mg | INTRAVENOUS | Status: AC
  Administered 2013-01-03: 1000 mg via INTRAVENOUS
  Filled 2013-01-03: qty 10

## 2013-01-03 MED ORDER — OXYCODONE HCL 5 MG PO TABS
5.0000 mg | ORAL_TABLET | ORAL | Status: DC | PRN
  Administered 2013-01-03: 10 mg via ORAL
  Filled 2013-01-03: qty 1
  Filled 2013-01-03: qty 2

## 2013-01-03 MED ORDER — BUPIVACAINE HCL (PF) 0.5 % IJ SOLN
INTRAMUSCULAR | Status: AC
  Filled 2013-01-03: qty 30

## 2013-01-03 MED ORDER — CELECOXIB 200 MG PO CAPS
200.0000 mg | ORAL_CAPSULE | Freq: Two times a day (BID) | ORAL | Status: DC
  Administered 2013-01-03 – 2013-01-05 (×4): 200 mg via ORAL
  Filled 2013-01-03 (×5): qty 1

## 2013-01-03 MED ORDER — FLEET ENEMA 7-19 GM/118ML RE ENEM
1.0000 | ENEMA | Freq: Once | RECTAL | Status: AC | PRN
  Filled 2013-01-03: qty 1

## 2013-01-03 MED ORDER — METHOCARBAMOL 500 MG PO TABS: 500.0000 mg | ORAL_TABLET | Freq: Four times a day (QID) | ORAL | Status: DC | PRN

## 2013-01-03 MED ORDER — MENTHOL 3 MG MT LOZG: 1.0000 | LOZENGE | OROMUCOSAL | Status: DC | PRN

## 2013-01-03 MED ORDER — FUROSEMIDE 20 MG PO TABS
20.0000 mg | ORAL_TABLET | Freq: Every day | ORAL | Status: DC
  Administered 2013-01-03 – 2013-01-05 (×3): 20 mg via ORAL
  Filled 2013-01-03 (×4): qty 1

## 2013-01-03 MED ORDER — ONDANSETRON HCL 4 MG PO TABS: 4.0000 mg | ORAL_TABLET | Freq: Four times a day (QID) | ORAL | Status: DC | PRN

## 2013-01-03 MED ORDER — DOCUSATE SODIUM 100 MG PO CAPS
100.0000 mg | ORAL_CAPSULE | Freq: Two times a day (BID) | ORAL | Status: DC
  Administered 2013-01-03 – 2013-01-05 (×4): 100 mg via ORAL
  Filled 2013-01-03 (×4): qty 1

## 2013-01-03 MED ORDER — SODIUM CHLORIDE 0.9 % IV SOLN
INTRAVENOUS | Status: DC
  Administered 2013-01-03: 16:00:00 via INTRAVENOUS

## 2013-01-03 MED ORDER — SENNOSIDES-DOCUSATE SODIUM 8.6-50 MG PO TABS: 1.0000 | ORAL_TABLET | Freq: Every evening | ORAL | Status: DC | PRN

## 2013-01-03 MED ORDER — METOCLOPRAMIDE HCL 5 MG PO TABS: 5.0000 mg | ORAL_TABLET | Freq: Three times a day (TID) | ORAL | Status: DC | PRN

## 2013-01-03 SURGICAL SUPPLY — 56 items
BANDAGE ESMARK 6X9 LF (GAUZE/BANDAGES/DRESSINGS) ×1 IMPLANT
BLADE SAGITTAL 13X1.27X60 (BLADE) ×2 IMPLANT
BLADE SAW SGTL 83.5X18.5 (BLADE) ×2 IMPLANT
BNDG CMPR 9X6 STRL LF SNTH (GAUZE/BANDAGES/DRESSINGS) ×1
BNDG ESMARK 6X9 LF (GAUZE/BANDAGES/DRESSINGS) ×2
BOWL SMART MIX CTS (DISPOSABLE) ×2 IMPLANT
CATH KIT ON Q 5IN SLV (PAIN MANAGEMENT) ×1 IMPLANT
CEMENT BONE SIMPLEX SPEEDSET (Cement) ×4 IMPLANT
CLOTH BEACON ORANGE TIMEOUT ST (SAFETY) ×2 IMPLANT
COVER SURGICAL LIGHT HANDLE (MISCELLANEOUS) ×2 IMPLANT
CUFF TOURNIQUET SINGLE 34IN LL (TOURNIQUET CUFF) ×2 IMPLANT
DRAPE EXTREMITY T 121X128X90 (DRAPE) ×2 IMPLANT
DRAPE INCISE IOBAN 66X45 STRL (DRAPES) ×4 IMPLANT
DRAPE PROXIMA HALF (DRAPES) ×2 IMPLANT
DRAPE U-SHAPE 47X51 STRL (DRAPES) ×2 IMPLANT
DRSG ADAPTIC 3X8 NADH LF (GAUZE/BANDAGES/DRESSINGS) ×2 IMPLANT
DRSG PAD ABDOMINAL 8X10 ST (GAUZE/BANDAGES/DRESSINGS) ×2 IMPLANT
DURAPREP 26ML APPLICATOR (WOUND CARE) ×4 IMPLANT
ELECT REM PT RETURN 9FT ADLT (ELECTROSURGICAL) ×2
ELECTRODE REM PT RTRN 9FT ADLT (ELECTROSURGICAL) ×1 IMPLANT
EVACUATOR 1/8 PVC DRAIN (DRAIN) ×2 IMPLANT
GLOVE BIOGEL M 7.0 STRL (GLOVE) ×1 IMPLANT
GLOVE BIOGEL PI IND STRL 7.5 (GLOVE) IMPLANT
GLOVE BIOGEL PI IND STRL 8.5 (GLOVE) ×1 IMPLANT
GLOVE BIOGEL PI INDICATOR 7.5 (GLOVE) ×1
GLOVE BIOGEL PI INDICATOR 8.5 (GLOVE)
GLOVE BIOGEL PI ORTHO PRO SZ8 (GLOVE) ×1
GLOVE PI ORTHO PRO STRL SZ8 (GLOVE) ×1 IMPLANT
GLOVE SURG ORTHO 8.0 STRL STRW (GLOVE) ×3 IMPLANT
GOWN PREVENTION PLUS XLARGE (GOWN DISPOSABLE) ×4 IMPLANT
GOWN STRL NON-REIN LRG LVL3 (GOWN DISPOSABLE) ×4 IMPLANT
HANDPIECE INTERPULSE COAX TIP (DISPOSABLE) ×2
HOOD PEEL AWAY FACE SHEILD DIS (HOOD) ×8 IMPLANT
KIT BASIN OR (CUSTOM PROCEDURE TRAY) ×2 IMPLANT
KIT ROOM TURNOVER OR (KITS) ×2 IMPLANT
MANIFOLD NEPTUNE II (INSTRUMENTS) ×2 IMPLANT
NEEDLE 22X1 1/2 (OR ONLY) (NEEDLE) IMPLANT
NS IRRIG 1000ML POUR BTL (IV SOLUTION) ×2 IMPLANT
PACK TOTAL JOINT (CUSTOM PROCEDURE TRAY) ×2 IMPLANT
PAD ARMBOARD 7.5X6 YLW CONV (MISCELLANEOUS) ×4 IMPLANT
PADDING CAST COTTON 6X4 STRL (CAST SUPPLIES) ×2 IMPLANT
SET HNDPC FAN SPRY TIP SCT (DISPOSABLE) ×1 IMPLANT
SPONGE GAUZE 4X4 12PLY (GAUZE/BANDAGES/DRESSINGS) ×2 IMPLANT
STAPLER VISISTAT 35W (STAPLE) ×2 IMPLANT
SUCTION FRAZIER TIP 10 FR DISP (SUCTIONS) ×2 IMPLANT
SUT BONE WAX W31G (SUTURE) ×2 IMPLANT
SUT VIC AB 0 CTB1 27 (SUTURE) ×4 IMPLANT
SUT VIC AB 1 CT1 27 (SUTURE) ×4
SUT VIC AB 1 CT1 27XBRD ANBCTR (SUTURE) ×2 IMPLANT
SUT VIC AB 2-0 CT1 27 (SUTURE) ×4
SUT VIC AB 2-0 CT1 TAPERPNT 27 (SUTURE) ×2 IMPLANT
SYR CONTROL 10ML LL (SYRINGE) IMPLANT
TOWEL OR 17X24 6PK STRL BLUE (TOWEL DISPOSABLE) ×2 IMPLANT
TOWEL OR 17X26 10 PK STRL BLUE (TOWEL DISPOSABLE) ×2 IMPLANT
TRAY FOLEY CATH 14FR (SET/KITS/TRAYS/PACK) ×1 IMPLANT
WATER STERILE IRR 1000ML POUR (IV SOLUTION) ×4 IMPLANT

## 2013-01-03 NOTE — Care Management Note (Signed)
  Page 1 of 1   01/03/2013     4:09:39 PM   CARE MANAGEMENT NOTE 01/03/2013  Patient:  [name], [name]   Account Number:  [id number]  Date Initiated:  01/03/2013  Documentation initiated by:  Ronny Flurry  Subjective/Objective Assessment:     Action/Plan:   Anticipated DC Date:     Anticipated DC Plan:  HOME W HOME HEALTH SERVICES         Choice offered to / List presented to:          [facility] arranged  HH-2 PT      [facility] agency  [facility]   Status of service:   Medicare Important Message given?   (If response is "NO", the following Medicare IM given date fields will be blank) Date Medicare IM given:   Date Additional Medicare IM given:    Discharge Disposition:    Per UR Regulation:    If discussed at Long Length of Stay Meetings, dates discussed:    Comments:  01-03-13 Patient states he is set up with [place] for home health PT , and already has BSC, walker , cane , and CPM at home . Call placed to [facility] to confirm. Ronny Flurry RN BSN

## 2013-01-03 NOTE — Progress Notes (Signed)
Rec'd report from Charlann Boxer RN, assuming care of pt

## 2013-01-03 NOTE — Progress Notes (Signed)
Report given to Diane RN

## 2013-01-03 NOTE — Progress Notes (Signed)
Orthopedic Tech Progress Note Patient Details:  [name] [birth year] [id number] CPM applied to Right LE with appropriate settings. Footsie roll provided. No OHF available at this time.  CPM Right Knee CPM Right Knee: On Right Knee Flexion (Degrees): 90 Right Knee Extension (Degrees): 0   Asia R Thompson 01/03/2013, 11:04 AM

## 2013-01-03 NOTE — H&P (Signed)
[name] MRN:  [id number] DOB/SEX:  Jul 17, 1931/male  CHIEF COMPLAINT:  Painful right Knee  HISTORY: Patient is a 79 y.o. male presented with a history of pain in the right knee. Onset of symptoms was gradual starting several years ago with gradually worsening course since that time. Prior procedures on the knee include meniscectomy. Patient has been treated conservatively with over-the-counter NSAIDs and activity modification. Patient currently rates pain in the knee at 9 out of 10 with activity. There is pain at night.  PAST MEDICAL HISTORY: There are no active problems to display for this patient.  Past Medical History  Diagnosis Date  . Myocardial infarction 1982  . Coronary artery disease   . Hypertension   . Hyperlipemia   . Chronic kidney disease     followed by dr. fox for kidney function  . GERD (gastroesophageal reflux disease)     ulcer  . Arthritis   . AAA (abdominal aortic aneurysm)     by Dr. Dulce [name] notes, 4.3 cm by U/S 05/2012   Past Surgical History  Procedure Laterality Date  . Kidney stents    . Finger amputation    . Finger surgery      on right hand from trauma as child  . Cholecystectomy    . Eye surgery      bilateral cataracts  . Total knee arthroplasty  06/21/2012    Procedure: TOTAL KNEE ARTHROPLASTY;  Surgeon: Dannielle Huh, MD;  Location: [facility] OR;  Service: Orthopedics;  Laterality: Left;  . Fracture surgery Left     as a child     MEDICATIONS:   Prescriptions prior to admission  Medication Sig Dispense Refill  . allopurinol (ZYLOPRIM) 100 MG tablet Take 100 mg by mouth 2 (two) times daily.      Marland Kitchen aspirin EC 81 MG tablet Take 81 mg by mouth daily.      Marland Kitchen atenolol (TENORMIN) 100 MG tablet Take 50 mg by mouth daily.       Marland Kitchen atorvastatin (LIPITOR) 80 MG tablet Take 80 mg by mouth every evening.      Marland Kitchen b complex vitamins capsule Take 1 capsule by mouth daily.      Marland Kitchen ezetimibe (ZETIA) 10 MG tablet Take 10 mg by mouth at bedtime.      . Fe  Fum-FePoly-FA-Vit C-Vit B3 (INTEGRA F) 125-1 MG CAPS Take 1 capsule by mouth daily.      . folic acid (FOLVITE) 400 MCG tablet Take 400 mcg by mouth daily.      . furosemide (LASIX) 40 MG tablet Take 20 mg by mouth daily.      . hydrochlorothiazide (HYDRODIURIL) 25 MG tablet Take 25 mg by mouth daily.      . isosorbide-hydrALAZINE (BIDIL) 20-37.5 MG per tablet Take 1 tablet by mouth daily.      . Multiple Vitamins-Minerals (CENTRUM SILVER PO) Take 1 tablet by mouth daily.      Marland Kitchen omeprazole (PRILOSEC) 20 MG capsule Take 20 mg by mouth daily.      Marland Kitchen oxyCODONE (OXY IR/ROXICODONE) 5 MG immediate release tablet Take 1-2 tablets (5-10 mg total) by mouth every 4 (four) hours as needed.  90 tablet  0  . OxyCODONE (OXYCONTIN) 10 mg TB12 Take 1 tablet (10 mg total) by mouth every 12 (twelve) hours.  30 tablet  0  . methocarbamol (ROBAXIN) 500 MG tablet Take 1 tablet (500 mg total) by mouth every 6 (six) hours as needed.  60 tablet  0  ALLERGIES:   Allergies  Allergen Reactions  . Ace Inhibitors   . Plavix (Clopidogrel)     hives    REVIEW OF SYSTEMS:  Pertinent items are noted in HPI.   FAMILY HISTORY:  History reviewed. No pertinent family history.  SOCIAL HISTORY:   History  Substance Use Topics  . Smoking status: Never Smoker   . Smokeless tobacco: Not on file  . Alcohol Use: No     EXAMINATION:  Vital signs in last 24 hours: Temp:  [97.6 F (36.4 C)] 97.6 F (36.4 C) (06/02 0649) Pulse Rate:  [67] 67 (06/02 0649) Resp:  [18] 18 (06/02 0649) BP: (110)/(65) 110/65 mmHg (06/02 0649) SpO2:  [97 %] 97 % (06/02 0649)  BP 110/65  Pulse 67  Temp(Src) 97.6 F (36.4 C) (Oral)  Resp 18  SpO2 97%  General Appearance:    Alert, cooperative, no distress, appears stated age  Head:    Normocephalic, without obvious abnormality, atraumatic                    Lungs:     Clear to auscultation bilaterally, respirations unlabored  Chest wall:    No tenderness or deformity  Heart:     Regular rate and rhythm, S1 and S2 normal, no murmur, rub   or gallop  Abdomen:     Soft, non-tender, bowel sounds active all four quadrants,    no masses, no organomegaly        Extremities:   Extremities normal, atraumatic, no cyanosis or edema  Pulses:   2+ and symmetric all extremities  Skin:   Skin color, texture, turgor normal, no rashes or lesions     Neurologic:   CNII-XII intact. Normal strength, sensation and reflexes      throughout    Musculoskeletal:  ROM 0-115, Ligaments intact,  Imaging Review Plain radiographs demonstrate severe degenerative joint disease of the right knee. The overall alignment is mild valgus. The bone quality appears to be good for age and reported activity level.  Assessment/Plan: End stage arthritis, right knee   The patient history, physical examination and imaging studies are consistent with advanced degenerative joint disease of the right knee. The patient has failed conservative treatment.  The clearance notes were reviewed.  After discussion with the patient it was felt that Total Knee Replacement was indicated. The procedure,  risks, and benefits of total knee arthroplasty were presented and reviewed. The risks including but not limited to aseptic loosening, infection, blood clots, vascular injury, stiffness, patella tracking problems complications among others were discussed. The patient acknowledged the explanation, agreed to proceed with the plan.  [name] 01/03/2013, 6:56 AM

## 2013-01-03 NOTE — Anesthesia Preprocedure Evaluation (Addendum)
Anesthesia Evaluation  Patient identified by MRN, date of birth, ID band Patient awake    Reviewed: Allergy & Precautions, H&P , NPO status , Patient's Chart, lab work & pertinent test results, reviewed documented beta blocker date and time   Airway Mallampati: II      Dental  (+) Poor Dentition   Pulmonary  breath sounds clear to auscultation        Cardiovascular hypertension, Pt. on home beta blockers + CAD, + Past MI and + Peripheral Vascular Disease Rhythm:Regular Rate:Normal     Neuro/Psych    GI/Hepatic GERD-  Controlled,  Endo/Other    Renal/GU Renal disease     Musculoskeletal   Abdominal   Peds  Hematology   Anesthesia Other Findings   Reproductive/Obstetrics                         Anesthesia Physical Anesthesia Plan  ASA: III  Anesthesia Plan: General   Post-op Pain Management:    Induction: Intravenous  Airway Management Planned: LMA  Additional Equipment:   Intra-op Plan:   Post-operative Plan: Extubation in OR  Informed Consent: I have reviewed the patients History and Physical, chart, labs and discussed the procedure including the risks, benefits and alternatives for the proposed anesthesia with the patient or authorized representative who has indicated his/her understanding and acceptance.   Dental advisory given  Plan Discussed with: CRNA and Anesthesiologist  Anesthesia Plan Comments:         Anesthesia Quick Evaluation

## 2013-01-03 NOTE — Anesthesia Procedure Notes (Signed)
Procedure Name: LMA Insertion Date/Time: 01/03/2013 8:41 AM Performed by: Sharlene Dory E Pre-anesthesia Checklist: Patient identified, Emergency Drugs available, Suction available, Patient being monitored and Timeout performed Patient Re-evaluated:Patient Re-evaluated prior to inductionOxygen Delivery Method: Circle system utilized Preoxygenation: Pre-oxygenation with 100% oxygen Intubation Type: IV induction LMA: LMA inserted LMA Size: 4.0 Number of attempts: 1 Placement Confirmation: positive ETCO2 and breath sounds checked- equal and bilateral Tube secured with: Tape Dental Injury: Teeth and Oropharynx as per pre-operative assessment

## 2013-01-03 NOTE — Preoperative (Signed)
Beta Blockers   Reason not to administer Beta Blockers:Not Applicable 

## 2013-01-03 NOTE — Transfer of Care (Signed)
Immediate Anesthesia Transfer of Care Note  Patient: [name]  Procedure(s) Performed: Procedure(s): TOTAL KNEE ARTHROPLASTY (Right)  Patient Location: PACU  Anesthesia Type:General  Level of Consciousness: awake, alert  and oriented  Airway & Oxygen Therapy: Patient Spontanous Breathing and Patient connected to face mask  Post-op Assessment: Report given to PACU RN, Post -op Vital signs reviewed and stable and Patient moving all extremities X 4  Post vital signs: Reviewed and stable  Complications: No apparent anesthesia complications

## 2013-01-03 NOTE — Anesthesia Postprocedure Evaluation (Signed)
  Anesthesia Post-op Note  Patient: [name]  Procedure(s) Performed: Procedure(s): TOTAL KNEE ARTHROPLASTY (Right)  Patient Location: PACU  Anesthesia Type:General  Level of Consciousness: awake, alert  and oriented  Airway and Oxygen Therapy: Patient Spontanous Breathing and Patient connected to nasal cannula oxygen  Post-op Pain: mild  Post-op Assessment: Post-op Vital signs reviewed, Patient's Cardiovascular Status Stable, Respiratory Function Stable, Patent Airway, No signs of Nausea or vomiting and Pain level controlled  Post-op Vital Signs: stable  Complications: No apparent anesthesia complications

## 2013-01-04 ENCOUNTER — Encounter (HOSPITAL_COMMUNITY): Payer: Self-pay | Admitting: Orthopedic Surgery

## 2013-01-04 LAB — BASIC METABOLIC PANEL WITH GFR
BUN: 43 mg/dL — ABNORMAL HIGH (ref 6–23)
CO2: 23 meq/L (ref 19–32)
Calcium: 8.5 mg/dL (ref 8.4–10.5)
Chloride: 96 meq/L (ref 96–112)
Creatinine, Ser: 2.53 mg/dL — ABNORMAL HIGH (ref 0.50–1.35)
GFR calc Af Amer: 26 mL/min — ABNORMAL LOW
GFR calc non Af Amer: 22 mL/min — ABNORMAL LOW
Glucose, Bld: 149 mg/dL — ABNORMAL HIGH (ref 70–99)
Potassium: 4.1 meq/L (ref 3.5–5.1)
Sodium: 135 meq/L (ref 135–145)

## 2013-01-04 LAB — CBC
HCT: 36.7 % — ABNORMAL LOW (ref 39.0–52.0)
Hemoglobin: 12.3 g/dL — ABNORMAL LOW (ref 13.0–17.0)
MCH: 31.1 pg (ref 26.0–34.0)
MCHC: 33.5 g/dL (ref 30.0–36.0)
MCV: 92.7 fL (ref 78.0–100.0)
Platelets: 209 K/uL (ref 150–400)
RBC: 3.96 MIL/uL — ABNORMAL LOW (ref 4.22–5.81)
RDW: 14.8 % (ref 11.5–15.5)
WBC: 14 K/uL — ABNORMAL HIGH (ref 4.0–10.5)

## 2013-01-04 MED ORDER — ENOXAPARIN SODIUM 40 MG/0.4ML ~~LOC~~ SOLN: 40.0000 mg | SUBCUTANEOUS | Status: AC

## 2013-01-04 MED ORDER — OXYCODONE HCL ER 20 MG PO T12A: 20.0000 mg | EXTENDED_RELEASE_TABLET | Freq: Two times a day (BID) | ORAL | Status: AC

## 2013-01-04 MED ORDER — METHOCARBAMOL 500 MG PO TABS: 500.0000 mg | ORAL_TABLET | Freq: Four times a day (QID) | ORAL | Status: AC | PRN

## 2013-01-04 MED ORDER — CELECOXIB 200 MG PO CAPS: 200.0000 mg | ORAL_CAPSULE | Freq: Two times a day (BID) | ORAL | Status: AC

## 2013-01-04 MED ORDER — OXYCODONE HCL 5 MG PO TABS: 5.0000 mg | ORAL_TABLET | ORAL | Status: AC | PRN

## 2013-01-04 NOTE — Progress Notes (Signed)
MEDICATION RELATED CONSULT NOTE  Pharmacy Re:  Celecoxib Indication: Renal Impairment Labs:  Recent Labs  01/03/13 1620 01/04/13 0512  CREATININE 2.17* 2.53*   Estimated Creatinine Clearance: 27.9 ml/min (by C-G formula based on Cr of 2.53).    Medications:  Scheduled:  . allopurinol  100 mg Oral BID  . atenolol  50 mg Oral Daily  . atorvastatin  80 mg Oral QPM  . celecoxib  200 mg Oral Q12H  . docusate sodium  100 mg Oral BID  . enoxaparin (LOVENOX) injection  30 mg Subcutaneous Q12H  . ezetimibe  10 mg Oral QHS  . furosemide  20 mg Oral Daily  . hydrochlorothiazide  25 mg Oral Daily  . isosorbide-hydrALAZINE  1 tablet Oral Daily  . OxyCODONE  20 mg Oral Q12H  . pantoprazole  40 mg Oral Daily   Assessment: 79 yo male on oral Celebrex for knee pain.  His creatinine has continued to decline and now his estimated clearance is 10ml/min.  Please note the following from the safety data on the use of NSAID's with severe renal impairment:  Caution should be exercised in prescribing CELEBREX to patients with risk factors for cardiovascular disease, cerebrovascular disease or renal disease, such as any of the following (NOT an exhaustive list):  Marland Kitchen  Hypertension   Dyslipidemia / Hyperlipidemia   Diabetes Mellitus   Congestive Heart Failure (NYHA I)   Coronary Artery Disease (Atherosclerosis)   Peripheral Arterial Disease   Smoking   Creatinine Clearance < 60 mL/min or 1 mL/sec   General  Frail or debilitated patients may tolerate side effects less well and therefore special care should be taken in treating this population. To minimize the potential risk for an adverse event, the lowest effective dose should be used for the shortest possible duration. As with other NSAIDs, caution should be used in the treatment of elderly patients who are more likely to be suffering from impaired renal, hepatic or cardiac function. For high-risk patients, alternate therapies that do not involve  NSAIDs should be considered.   Plan:  1.  Please consider using alternative pain medication until his renal function improves (could try Ultracet 1-2 tablets every 12 hours as needed). 2.  If alternative is not desirable, consider shortest possible duration.  Nadara Mustard, PharmD., MS Clinical Pharmacist Pager:  [phone] Thank you for allowing pharmacy to be part of this patients care team. 01/04/2013,7:23 PM

## 2013-01-04 NOTE — Progress Notes (Signed)
SPORTS MEDICINE AND JOINT REPLACEMENT  Georgena Spurling, MD   Altamese Cabal, PA-C [address] [place], [place], [place]  [address]                             [phone]   PROGRESS NOTE  Subjective:  negative for Chest Pain  negative for Shortness of Breath  negative for Nausea/Vomiting   negative for Calf Pain  negative for Bowel Movement   Tolerating Diet: yes         Patient reports pain as 4 on 0-10 scale.    Objective: Vital signs in last 24 hours:   Patient Vitals for the past 24 hrs:  BP Temp Temp src Pulse Resp SpO2  01/04/13 1802 127/49 mmHg 98.5 F (36.9 C) Oral 62 18 97 %  01/04/13 1600 - - - - 18 95 %  01/04/13 1456 125/59 mmHg 98.5 F (36.9 C) Oral 67 18 94 %  01/04/13 1200 - - - - 22 92 %  01/04/13 0900 123/61 mmHg 98 F (36.7 C) Oral 65 18 95 %  01/04/13 0515 146/69 mmHg 98.1 F (36.7 C) Oral 68 20 94 %  01/04/13 0157 154/74 mmHg 98.1 F (36.7 C) Oral 80 20 96 %  01/03/13 2130 138/69 mmHg 97.3 F (36.3 C) Oral 70 20 96 %  01/03/13 2000 - - - - 20 94 %    @flow {1959:LAST@   Intake/Output from previous day:   06/02 0701 - 06/03 0700 In: 1700 [P.O.:340; I.V.:1310] Out: 650 [Urine:300; Drains:325]   Intake/Output this shift:       Intake/Output     06/03 0701 - 06/04 0700   P.O. 480   I.V. (mL/kg)    IV Piggyback    Total Intake(mL/kg) 480 (4.1)   Urine (mL/kg/hr)    Drains    Blood    Total Output     Net +480       Urine Occurrence 350 x   Stool Occurrence       LABORATORY DATA:  Recent Labs  01/03/13 1620 01/04/13 0512  WBC 11.8* 14.0*  HGB 12.0* 12.3*  HCT 35.7* 36.7*  PLT 191 209    Recent Labs  01/03/13 1620 01/04/13 0512  NA  --  135  K  --  4.1  CL  --  96  CO2  --  23  BUN  --  43*  CREATININE 2.17* 2.53*  GLUCOSE  --  149*  CALCIUM  --  8.5   Lab Results  Component Value Date   INR 0.91 12/23/2012   INR 0.96 06/11/2012   INR 0.87 05/14/2011    Examination:  General appearance: alert, cooperative and  no distress Extremities: extremities normal, atraumatic, no cyanosis or edema and Homans sign is negative, no sign of DVT  Wound Exam: clean, dry, intact   Drainage:  None: wound tissue dry  Motor Exam: EHL and FHL Intact  Sensory Exam: Deep Peroneal normal   Assessment:    1 Day Post-Op  Procedure(s) (LRB): TOTAL KNEE ARTHROPLASTY (Right)  ADDITIONAL DIAGNOSIS:  Active Problems:   * No active hospital problems. *  Acute Blood Loss Anemia   Plan: Physical Therapy as ordered Weight Bearing as Tolerated (WBAT)  DVT Prophylaxis:  Lovenox  DISCHARGE PLAN: Home  DISCHARGE NEEDS: HHPT, CPM, Walker and 3-in-1 comode seat         Joniya Boberg 01/04/2013, 7:39 PM

## 2013-01-04 NOTE — Progress Notes (Signed)
Physical Therapy Treatment Patient Details Name: [name] MRN: [id number] DOB: 05-21-1931 Today's Date: 01/04/2013 Time: 4098-1191 PT Time Calculation (min): 33 min  PT Assessment / Plan / Recommendation Comments on Treatment Session  Pt able to make great progress and increase overall ambulation.  Plan to perform stairs next session with family present.  Will continue to follow.    Follow Up Recommendations  Home health PT;Supervision/Assistance - 24 hour     Does the patient have the potential to tolerate intense rehabilitation     Barriers to Discharge        Equipment Recommendations  None recommended by PT    Recommendations for Other Services    Frequency 7X/week   Plan Discharge plan remains appropriate;Frequency remains appropriate    Precautions / Restrictions Precautions Precautions: Knee Restrictions Weight Bearing Restrictions: Yes RLE Weight Bearing: Weight bearing as tolerated   Pertinent Vitals/Pain No c/o pain prior to mobility; mild pain but did not rate with ambulation    Mobility  Bed Mobility Bed Mobility: Sit to Supine Sit to Supine: 4: Min assist;HOB flat Details for Bed Mobility Assistance: (A) to elevate LE into bed with cues for proper technique.  Pt adamant about leaving rail up with bed mobility even though pt will not have rail at home Transfers Transfers: Sit to Stand;Stand to Sit Sit to Stand: 4: Min assist;From chair/3-in-1 Stand to Sit: 4: Min assist;To bed Details for Transfer Assistance: (A) to initiate transfers and slowly descend to recliner with max cues for hand placement.  Pt continues to keep hands on RW after cues for proper hand placement Ambulation/Gait Ambulation/Gait Assistance: 4: Min guard Ambulation Distance (Feet): 200 Feet Assistive device: Rolling walker Ambulation/Gait Assistance Details: Minguard for safety with cues for step sequence and LE placement.  Cues for upright posture and gaze. Gait Pattern: Step-to  pattern;Decreased stride length;Antalgic Gait velocity: decreased Stairs: No    Exercises Total Joint Exercises Ankle Circles/Pumps: AAROM;Both;10 reps Quad Sets: Strengthening;Right;5 reps;Supine Heel Slides: AAROM;Strengthening;5 reps;Supine Goniometric ROM: 10-65   PT Diagnosis:    PT Problem List:   PT Treatment Interventions:     PT Goals Acute Rehab PT Goals PT Goal Formulation: With patient Time For Goal Achievement: 01/11/13 Potential to Achieve Goals: Good Pt will go Sit to Supine/Side: with modified independence PT Goal: Sit to Supine/Side - Progress: Progressing toward goal Pt will go Sit to Stand: with modified independence PT Goal: Sit to Stand - Progress: Progressing toward goal Pt will go Stand to Sit: with modified independence PT Goal: Stand to Sit - Progress: Progressing toward goal Pt will Ambulate: >150 feet;with modified independence;with rolling walker PT Goal: Ambulate - Progress: Progressing toward goal Pt will Perform Home Exercise Program: with supervision, verbal cues required/provided PT Goal: Perform Home Exercise Program - Progress: Progressing toward goal  Visit Information  Last PT Received On: 01/04/13 Assistance Needed: +1    Subjective Data  Subjective: "I think I'm going home tomorrow." Patient Stated Goal: To go home tomorrow now   Cognition  Cognition Arousal/Alertness: Awake/alert Behavior During Therapy: WFL for tasks assessed/performed Overall Cognitive Status: Within Functional Limits for tasks assessed    Balance     End of Session PT - End of Session Equipment Utilized During Treatment: Gait belt Activity Tolerance: Patient tolerated treatment well Patient left: in bed;in CPM Nurse Communication: Mobility status;Weight bearing status CPM Right Knee CPM Right Knee: Off   GP     Sundi Slevin 01/04/2013, 2:58 PM Jake Shark, PT DPT  [phone]    

## 2013-01-04 NOTE — Evaluation (Signed)
Physical Therapy Evaluation Patient Details Name: [name] MRN: [id number] DOB: [birth year] Today's Date: 01/04/2013 Time: 4098-1191 PT Time Calculation (min): 27 min  PT Assessment / Plan / Recommendation Clinical Impression  Pt is 79 y/o male admitted for s/p right TKA.  Pt will benefit from acute PT services to improve overall mobility and gait to prepare for safe d/c home with family.    PT Assessment  Patient needs continued PT services    Follow Up Recommendations  Home health PT;Supervision/Assistance - 24 hour    Does the patient have the potential to tolerate intense rehabilitation      Barriers to Discharge None      Equipment Recommendations  None recommended by PT    Recommendations for Other Services     Frequency 7X/week    Precautions / Restrictions Precautions Precautions: Knee Restrictions Weight Bearing Restrictions: Yes RLE Weight Bearing: Weight bearing as tolerated   Pertinent Vitals/Pain 5/10 right knee pain      Mobility  Bed Mobility Bed Mobility: Supine to Sit Supine to Sit: 3: Mod assist;With rails Details for Bed Mobility Assistance: (A) to elevate trunk OOB.  Pt giving cues on how to assist getting OOB.  Educated pt on attempting to perform bed mobitliy with limited assistance if able to prepare for d/c home. Transfers Transfers: Sit to Stand;Stand to Sit Sit to Stand: 4: Min assist;From bed Stand to Sit: 4: Min assist;To chair/3-in-1 Details for Transfer Assistance: (A) to initiate transfers and slowly descend to recliner with max cues for hand placement.  Pt continues to keep hands on RW after cues for proper hand placement Ambulation/Gait Ambulation/Gait Assistance: 4: Min assist Ambulation Distance (Feet): 15 Feet Assistive device: Rolling walker Ambulation/Gait Assistance Details: (A) to manage RW with cues for proper step sequence and RW placement.  Pt tends to ambualte too close to RW. Gait Pattern: Step-to pattern;Decreased  stride length;Antalgic Gait velocity: decreased Stairs: No    Exercises     PT Diagnosis: Difficulty walking;Abnormality of gait;Generalized weakness;Acute pain  PT Problem List: Decreased strength;Decreased range of motion;Decreased balance;Decreased mobility;Decreased coordination;Decreased knowledge of use of DME;Pain PT Treatment Interventions: DME instruction;Gait training;Stair training;Functional mobility training;Therapeutic activities;Patient/family education   PT Goals Acute Rehab PT Goals PT Goal Formulation: With patient Time For Goal Achievement: 01/11/13 Potential to Achieve Goals: Good Pt will go Supine/Side to Sit: with modified independence PT Goal: Supine/Side to Sit - Progress: Goal set today Pt will go Sit to Supine/Side: with modified independence PT Goal: Sit to Supine/Side - Progress: Goal set today Pt will go Sit to Stand: with modified independence PT Goal: Sit to Stand - Progress: Goal set today Pt will go Stand to Sit: with modified independence PT Goal: Stand to Sit - Progress: Goal set today Pt will Ambulate: >150 feet;with modified independence;with rolling walker PT Goal: Ambulate - Progress: Goal set today Pt will Go Up / Down Stairs: 3-5 stairs;with min assist;with least restrictive assistive device PT Goal: Up/Down Stairs - Progress: Goal set today Pt will Perform Home Exercise Program: with supervision, verbal cues required/provided PT Goal: Perform Home Exercise Program - Progress: Goal set today  Visit Information  Last PT Received On: 01/04/13 Assistance Needed: +1    Subjective Data  Subjective: "If you put your hand right here so I can pull up on the walker."  (pt giving therpaist cues on how to stand.  Educated pt on correct way to get to stand.) Patient Stated Goal: To go home today or tomorrow  Prior Functioning  Home Living Lives With: Spouse Available Help at Discharge: Family Type of Home: House Home Access: Stairs to  enter Secretary/administrator of Steps: 3 Entrance Stairs-Rails: Right;Left;Can reach both Home Layout: One level Bathroom Shower/Tub: Walk-in shower;Door [employer] Toilet: Standard Bathroom Accessibility: Yes How Accessible: Accessible via walker Home Adaptive Equipment: Bedside commode/3-in-1;Shower chair without back;Straight cane;Walker - rolling Prior Function Level of Independence: Independent Able to Take Stairs?: Yes Driving: Yes Vocation: Retired Musician: No difficulties Dominant Hand: Right    Cognition  Cognition Arousal/Alertness: Awake/alert Behavior During Therapy: WFL for tasks assessed/performed Overall Cognitive Status: Within Functional Limits for tasks assessed    Extremity/Trunk Assessment Right Lower Extremity Assessment RLE ROM/Strength/Tone: Deficits;Unable to fully assess;Due to pain RLE ROM/Strength/Tone Deficits: At least 3/5 gross with functional mobility.  Limited AAROM knee flexion to 10-60 degrees. Left Lower Extremity Assessment LLE ROM/Strength/Tone: Within functional levels   Balance    End of Session PT - End of Session Equipment Utilized During Treatment: Gait belt Activity Tolerance: Patient tolerated treatment well Patient left: in chair;with call bell/phone within reach Nurse Communication: Mobility status;Weight bearing status (when pt needs to be on CPM) CPM Right Knee CPM Right Knee: Off  GP     Makaylin Carlo 01/04/2013, 10:30 AM Jake Shark, PT DPT [phone]

## 2013-01-05 ENCOUNTER — Encounter (HOSPITAL_COMMUNITY): Payer: Self-pay | Admitting: General Practice

## 2013-01-05 LAB — BASIC METABOLIC PANEL
BUN: 56 mg/dL — ABNORMAL HIGH (ref 6–23)
CO2: 26 mEq/L (ref 19–32)
Calcium: 8.4 mg/dL (ref 8.4–10.5)
GFR calc non Af Amer: 14 mL/min — ABNORMAL LOW (ref >90)
Glucose, Bld: 129 mg/dL — ABNORMAL HIGH (ref 70–99)

## 2013-01-05 LAB — CBC
HCT: 32.7 % — ABNORMAL LOW (ref 39.0–52.0)
Hemoglobin: 10.9 g/dL — ABNORMAL LOW (ref 13.0–17.0)
MCH: 30.9 pg (ref 26.0–34.0)
MCHC: 33.3 g/dL (ref 30.0–36.0)

## 2013-01-05 NOTE — Progress Notes (Signed)
Physical Therapy Treatment Patient Details Name: [name] MRN: [id number] DOB: [birth year] Today's Date: 01/05/2013 Time: 1000-1043 PT Time Calculation (min): 43 min  PT Assessment / Plan / Recommendation Comments on Treatment Session  Pt continues to progress toward goals with stair education completed today. Needs cues for safety at times with mobiltiy. Pt's spouse present and educated on mobilty and exercises as well.    Follow Up Recommendations  Home health PT;Supervision/Assistance - 24 hour           Equipment Recommendations  None recommended by PT       Frequency 7X/week   Plan Discharge plan remains appropriate;Frequency remains appropriate    Precautions / Restrictions Precautions Precautions: Knee Restrictions RLE Weight Bearing: Weight bearing as tolerated       Mobility  Bed Mobility Bed Mobility: Sit to Supine Sit to Supine: 4: Min assist;HOB flat Details for Bed Mobility Assistance: no rail today with cues on how to use belt to lift right leg onto bed surface, min assist to complete clearance of bed. Pt argumentative on bed postion (wanting rail and bed elevated). Educated on why rail was lowered and bed flat and need to learn how to enter/exit bed at home. continued to be argumentative while performing bed mobility with instruction on technique and safety with bed mobiltiy.                  Transfers Sit to Stand: 4: Min guard;From chair/3-in-1;With upper extremity assist;With armrests Stand to Sit: 4: Min guard;To bed;With upper extremity assist Details for Transfer Assistance: cues for hand placment with transfers and for ant wt shifitng to assist with standing up. bed elevated to home bed height and pt with controlled descent to higher surface. Ambulation/Gait Ambulation/Gait Assistance: 5: Supervision Ambulation Distance (Feet): 200 Feet Assistive device: Rolling walker Ambulation/Gait Assistance Details: cues for posture, incr'd right step length and  decr UE reliance on walker. Gait Pattern: Step-to pattern;Decreased stance time - right;Decreased step length - right;Decreased step length - left;Trunk flexed;Antalgic Gait velocity: Very decreased Stairs: Yes Stairs Assistance: 4: Min assist Stairs Assistance Details (indicate cue type and reason): pt has beed going up/down sideways at home prior to surgery. Has two rails that he can reach. Educated that two rails forward (so he can see where he is going and have two sources of support ) would be safer. Pt concerned about not being able to bend right knee with this method. Educated pt that would not be an issue and proceeded to educate pt on correct sequence with stairs.                 Stair Management Technique: Two rails;Step to pattern;Forwards Number of Stairs: 3    Exercises Total Joint Exercises Ankle Circles/Pumps: AROM;Both;10 reps;Seated Quad Sets: AROM;Strengthening;Right;10 reps;Seated Heel Slides: AAROM;Strengthening;Right;10 reps;Seated;Limitations Heel Slides Limitations: AA with 10 reps for incr'd knee flexion. Knee Flexion: AROM;AAROM;Right;5 reps;Seated;Limitations Knee Flexion Limitations: pt seated with foot on floor: active knee flexion x5, using left foot over right AA x5     PT Goals Acute Rehab PT Goals Pt will go Sit to Supine/Side: with modified independence PT Goal: Sit to Supine/Side - Progress: Progressing toward goal Pt will go Sit to Stand: with modified independence PT Goal: Sit to Stand - Progress: Progressing toward goal Pt will go Stand to Sit: with modified independence PT Goal: Stand to Sit - Progress: Progressing toward goal Pt will Ambulate: >150 feet;with modified independence;with rolling walker PT Goal: Ambulate -  Progress: Progressing toward goal Pt will Go Up / Down Stairs: 3-5 stairs;with min assist;with least restrictive assistive device PT Goal: Up/Down Stairs - Progress: Met Pt will Perform Home Exercise Program: with supervision,  verbal cues required/provided PT Goal: Perform Home Exercise Program - Progress: Progressing toward goal  Visit Information  Last PT Received On: 01/05/13 Assistance Needed: +1    Subjective Data  Subjective: "I am ready to go home"   Cognition  Cognition Arousal/Alertness: Awake/alert Behavior During Therapy: WFL for tasks assessed/performed Overall Cognitive Status: Within Functional Limits for tasks assessed       End of Session PT - End of Session Equipment Utilized During Treatment: Gait belt Activity Tolerance: Patient tolerated treatment well Patient left: in bed;with call bell/phone within reach;with family/visitor present Nurse Communication: Mobility status;Weight bearing status   GP     Sallyanne Kuster 01/05/2013, 10:57 AM  Sallyanne Kuster, PTA Office- [phone]

## 2013-01-05 NOTE — Evaluation (Signed)
Occupational Therapy Evaluation Patient Details Name: [name] MRN: [id number] DOB: Dec 14, 1930 Today's Date: 01/05/2013 Time: 4098-1191 OT Time Calculation (min): 24 min  OT Assessment / Plan / Recommendation Clinical Impression  Pt is recovering from R TKA, this being his second.  Pt has all necessary equipment at home and will have assist of his wife at home. All education completed. No further OT needs.    OT Assessment  Patient does not need any further OT services    Follow Up Recommendations  Supervision/Assistance - 24 hour    Barriers to Discharge      Equipment Recommendations  None recommended by OT    Recommendations for Other Services    Frequency       Precautions / Restrictions Precautions Precautions: Knee;Fall Restrictions Weight Bearing Restrictions: Yes RLE Weight Bearing: Weight bearing as tolerated   Pertinent Vitals/Pain R knee, premedicated, did not rate    ADL  Eating/Feeding: Independent Where Assessed - Eating/Feeding: Chair Grooming: Wash/dry hands;Supervision/safety Where Assessed - Grooming: Unsupported standing Upper Body Bathing: Set up Where Assessed - Upper Body Bathing: Unsupported sitting Lower Body Bathing: Moderate assistance Where Assessed - Lower Body Bathing: Unsupported sitting;Supported sit to stand Upper Body Dressing: Set up Where Assessed - Upper Body Dressing: Unsupported sitting Lower Body Dressing: Moderate assistance Where Assessed - Lower Body Dressing: Unsupported sitting;Supported sit to stand Toilet Transfer:  (stood at toilet to urinate with supervision) Toileting - Architect and Hygiene: Supervision/safety Where Assessed - Engineer, mining and Hygiene: Standing Equipment Used: Gait belt;Rolling walker Transfers/Ambulation Related to ADLs: min guard with RW ADL Comments: Pt has AE for LB ADL, but was reliant on his wife prior to surgery. Verbally instructed in shower transfer.    OT  Diagnosis:    OT Problem List:   OT Treatment Interventions:     OT Goals    Visit Information  Last OT Received On: 01/05/13 Assistance Needed: +1    Subjective Data  Subjective: "I'm ready to go home." Patient Stated Goal: Home with wife, regain independence.   Prior Functioning     Home Living Lives With: Spouse Available Help at Discharge: Family Type of Home: House Home Access: Stairs to enter Secretary/administrator of Steps: 3 Entrance Stairs-Rails: Right;Left;Can reach both Home Layout: One level Bathroom Shower/Tub: Walk-in shower;Door [employer] Toilet: Standard Bathroom Accessibility: Yes How Accessible: Accessible via walker Home Adaptive Equipment: Bedside commode/3-in-1;Shower chair without back;Straight cane;Walker - rolling Prior Function Level of Independence: Needs assistance Needs Assistance: Dressing (LB) Dressing: Moderate Able to Take Stairs?: Yes Driving: Yes Vocation: Retired Musician: No difficulties Dominant Hand: Right         Vision/Perception     Cognition  Cognition Arousal/Alertness: Awake/alert Behavior During Therapy: WFL for tasks assessed/performed Overall Cognitive Status: Within Functional Limits for tasks assessed    Extremity/Trunk Assessment Right Upper Extremity Assessment RUE ROM/Strength/Tone: Within functional levels Left Upper Extremity Assessment LUE ROM/Strength/Tone: Within functional levels     Mobility Bed Mobility Bed Mobility: Sit to Supine Supine to Sit: With rails;4: Min assist;HOB elevated Sit to Supine: 4: Min assist;HOB flat                  Transfers Transfers: Sit to Stand;Stand to Sit Sit to Stand: 4: Min guard;With upper extremity assist Stand to Sit: 4: Min guard;To bed;With upper extremity assist Details for Transfer Assistance: cues for hand placment with transfers and for ant wt shifitng to assist with standing up. bed elevated to home  bed height and pt with  controlled descent to higher surface.     Exercise   Balance     End of Session OT - End of Session Activity Tolerance: Patient tolerated treatment well Patient left: in chair;with call bell/phone within reach;with nursing in room;with family/visitor present Nurse Communication:  (ready for d/c)  GO     Evern Bio 01/05/2013, 11:45 AM [phone]

## 2013-01-05 NOTE — Discharge Summary (Signed)
SPORTS MEDICINE & JOINT REPLACEMENT   Georgena Spurling, MD   Altamese Cabal, PA-C [address] [place], [place], [place]  [address]                             [phone]  PATIENT ID: [name]        MRN:  [id number]          DOB/AGE: 09-01-1930 / 79 y.o.    DISCHARGE SUMMARY  ADMISSION DATE:    01/03/2013 DISCHARGE DATE:   01/05/2013   ADMISSION DIAGNOSIS: osteoarthritis right knee    DISCHARGE DIAGNOSIS:  osteoarthritis right knee    ADDITIONAL DIAGNOSIS: Active Problems:   * No active hospital problems. *  Past Medical History  Diagnosis Date  . Myocardial infarction 1982  . Coronary artery disease   . Hypertension   . Hyperlipemia   . Chronic kidney disease     followed by dr. fox for kidney function  . GERD (gastroesophageal reflux disease)     ulcer  . Arthritis   . AAA (abdominal aortic aneurysm)     by Dr. Dulce Sellar notes, 4.3 cm by U/S 05/2012    PROCEDURE: Procedure(s): TOTAL KNEE ARTHROPLASTY on 01/03/2013  CONSULTS:     HISTORY:  See H&P in chart  HOSPITAL COURSE:  Jshon Ibe Seivert is a 79 y.o. admitted on 01/03/2013 and found to have a diagnosis of osteoarthritis right knee.  After appropriate laboratory studies were obtained  they were taken to the operating room on 01/03/2013 and underwent Procedure(s): TOTAL KNEE ARTHROPLASTY.   They were given perioperative antibiotics:  Anti-infectives   Start     Dose/Rate Route Frequency Ordered Stop   01/03/13 1600  ceFAZolin (ANCEF) IVPB 2 g/50 mL premix     2 g 100 mL/hr over 30 Minutes Intravenous Every 6 hours 01/03/13 1457 01/03/13 2217   01/03/13 0600  ceFAZolin (ANCEF) 3 g in dextrose 5 % 50 mL IVPB     3 g 160 mL/hr over 30 Minutes Intravenous On call to O.R. 01/02/13 1344 01/03/13 0845    .  Tolerated the procedure well.  Placed with a foley intraoperatively.  Given Ofirmev at induction and for 48 hours.    POD# 1: Vital signs were stable.  Patient denied Chest pain, shortness of breath, or calf pain.   Patient was started on Lovenox 30 mg subcutaneously twice daily at 8am.  Consults to PT, OT, and care management were made.  The patient was weight bearing as tolerated.  CPM was placed on the operative leg 0-90 degrees for 6-8 hours a day.  Incentive spirometry was taught.  Dressing was changed.  Marcaine pump and hemovac were discontinued.      POD #2, Continued  PT for ambulation and exercise program.  IV saline locked.  O2 discontinued.    The remainder of the hospital course was dedicated to ambulation and strengthening.   The patient was discharged on 2 Days Post-Op in  Good condition.  Blood products given:none  DIAGNOSTIC STUDIES: Recent vital signs: Patient Vitals for the past 24 hrs:  BP Temp Temp src Pulse Resp SpO2  01/05/13 0558 110/50 mmHg 97.9 F (36.6 C) Oral 64 18 91 %  01/04/13 2148 125/64 mmHg 98.6 F (37 C) Oral 78 18 92 %  01/04/13 1802 127/49 mmHg 98.5 F (36.9 C) Oral 62 18 97 %       Recent laboratory studies:  Recent  Labs  01/03/13 1620 01/04/13 0512 01/05/13 0500  WBC 11.8* 14.0* 11.5*  HGB 12.0* 12.3* 10.9*  HCT 35.7* 36.7* 32.7*  PLT 191 209 178    Recent Labs  01/03/13 1620 01/04/13 0512 01/05/13 0500  NA  --  135 134*  K  --  4.1 4.2  CL  --  96 95*  CO2  --  23 26  BUN  --  43* 56*  CREATININE 2.17* 2.53* 3.82*  GLUCOSE  --  149* 129*  CALCIUM  --  8.5 8.4   Lab Results  Component Value Date   INR 0.91 12/23/2012   INR 0.96 06/11/2012   INR 0.87 05/14/2011     Recent Radiographic Studies :  No results found.  DISCHARGE INSTRUCTIONS: Discharge Orders   Future Orders Complete By Expires     CPM  As directed     Comments:      Continuous passive motion machine (CPM):      Use the CPM from 0 to 90 for 6-8 hours per day.      You may increase by 10 per day.  You may break it up into 2 or 3 sessions per day.      Use CPM for 2 weeks or until you are told to stop.    Call MD / Call 911  As directed     Comments:      If you  experience chest pain or shortness of breath, CALL 911 and be transported to the hospital emergency room.  If you develope a fever above 101 F, pus (white drainage) or increased drainage or redness at the wound, or calf pain, call your surgeon's office.    Change dressing  As directed     Comments:      Change dressing on thursday, then change the dressing daily with sterile 4 x 4 inch gauze dressing and apply TED hose.    Constipation Prevention  As directed     Comments:      Drink plenty of fluids.  Prune juice may be helpful.  You may use a stool softener, such as Colace (over the counter) 100 mg twice a day.  Use MiraLax (over the counter) for constipation as needed.    Diet - low sodium heart healthy  As directed     Do not put a pillow under the knee. Place it under the heel.  As directed     Driving restrictions  As directed     Comments:      No driving for 6 weeks    Increase activity slowly as tolerated  As directed     Lifting restrictions  As directed     Comments:      No lifting for 6 weeks    TED hose  As directed     Comments:      Use stockings (TED hose) for 3 weeks on both leg(s).  You may remove them at night for sleeping.       DISCHARGE MEDICATIONS:     Medication List    STOP taking these medications       aspirin EC 81 MG tablet     folic acid 400 MCG tablet  Commonly known as:  FOLVITE      TAKE these medications       allopurinol 100 MG tablet  Commonly known as:  ZYLOPRIM  Take 100 mg by mouth 2 (two) times daily.     atenolol  100 MG tablet  Commonly known as:  TENORMIN  Take 50 mg by mouth daily.     atorvastatin 80 MG tablet  Commonly known as:  LIPITOR  Take 80 mg by mouth every evening.     b complex vitamins capsule  Take 1 capsule by mouth daily.     celecoxib 200 MG capsule  Commonly known as:  CELEBREX  Take 1 capsule (200 mg total) by mouth every 12 (twelve) hours.     CENTRUM SILVER PO  Take 1 tablet by mouth daily.      enoxaparin 40 MG/0.4ML injection  Commonly known as:  LOVENOX  Inject 0.4 mLs (40 mg total) into the skin daily.     furosemide 40 MG tablet  Commonly known as:  LASIX  Take 20 mg by mouth daily.     hydrochlorothiazide 25 MG tablet  Commonly known as:  HYDRODIURIL  Take 25 mg by mouth daily.     INTEGRA F 125-1 MG Caps  Take 1 capsule by mouth daily.     isosorbide-hydrALAZINE 20-37.5 MG per tablet  Commonly known as:  BIDIL  Take 1 tablet by mouth daily.     methocarbamol 500 MG tablet  Commonly known as:  ROBAXIN  Take 1 tablet (500 mg total) by mouth every 6 (six) hours as needed.     omeprazole 20 MG capsule  Commonly known as:  PRILOSEC  Take 20 mg by mouth daily.     oxyCODONE 5 MG immediate release tablet  Commonly known as:  Oxy IR/ROXICODONE  Take 1-2 tablets (5-10 mg total) by mouth every 3 (three) hours as needed.     OxyCODONE 20 mg T12a  Commonly known as:  OXYCONTIN  Take 1 tablet (20 mg total) by mouth every 12 (twelve) hours.     ZETIA 10 MG tablet  Generic drug:  ezetimibe  Take 10 mg by mouth at bedtime.        FOLLOW UP VISIT:       Follow-up Information   Follow up with Raymon Mutton, MD. Call on 01/18/2013.   Contact information:   [address] [place] [address] [phone]       DISPOSITION: HOME   CONDITION:  Good   Norwood Quezada 01/05/2013, 5:24 PM

## 2013-01-05 NOTE — Progress Notes (Signed)
Dc INSTRUCTIONS GIVEN TO PT, NO QUESTIONS ASKED

## 2013-01-05 NOTE — Op Note (Signed)
TOTAL KNEE REPLACEMENT OPERATIVE NOTE:  01/03/2013  5:38 PM  PATIENT:  [name]  79 y.o. male  PRE-OPERATIVE DIAGNOSIS:  osteoarthritis right knee  POST-OPERATIVE DIAGNOSIS:  osteoarthritis right knee  PROCEDURE:  Procedure(s): TOTAL KNEE ARTHROPLASTY  SURGEON:  Surgeon(s): Dannielle Huh, MD  PHYSICIAN ASSISTANT: Altamese Cabal, [facility]  ANESTHESIA:   general  DRAINS: Hemovac  SPECIMEN: None  COUNTS:  Correct  TOURNIQUET:   Total Tourniquet Time Documented: Thigh (Right) - 36 minutes Total: Thigh (Right) - 36 minutes   DICTATION:  Indication for procedure:    The patient is a 78 y.o. male who has failed conservative treatment for osteoarthritis right knee.  Informed consent was obtained prior to anesthesia. The risks versus benefits of the operation were explain and in a way the patient can, and did, understand.   Description of procedure:     The patient was taken to the operating room and placed under anesthesia.  The patient was positioned in the usual fashion taking care that all body parts were adequately padded and/or protected.  I foley catheter was not placed.  A tourniquet was applied and the leg prepped and draped in the usual sterile fashion.  The extremity was exsanguinated with the esmarch and tourniquet inflated to 350 mmHg.  Pre-operative range of motion was normal.  The knee was in 7 degree of mild varus.  A midline incision approximately 6-7 inches long was made with a #10 blade.  A new blade was used to make a parapatellar arthrotomy going 2-3 cm into the quadriceps tendon, over the patella, and alongside the medial aspect of the patellar tendon.  A synovectomy was then performed with the #10 blade and forceps. I then elevated the deep MCL off the medial tibial metaphysis subperiosteally around to the semimembranosus attachment.    I everted the patella and used calipers to measure patellar thickness.  I used the reamer to ream down to appropriate  thickness to recreate the native thickness.  I then removed excess bone with the rongeur and sagittal saw.  I used the appropriately sized template and drilled the three lug holes.  I then put the trial in place and measured the thickness with the calipers to ensure recreation of the native thickness.  The trial was then removed and the patella subluxed and the knee brought into flexion.  A homan retractor was place to retract and protect the patella and lateral structures.  A Z-retractor was place medially to protect the medial structures.  The extra-medullary alignment system was used to make cut the tibial articular surface perpendicular to the anamotic axis of the tibia and in 3 degrees of posterior slope.  The cut surface and alignment jig was removed.  I then used the intramedullary alignment guide to make a 6 valgus cut on the distal femur.  I then marked out the epicondylar axis on the distal femur.  The posterior condylar axis measured 3 degrees.  I then used the anterior referencing sizer and measured the femur to be a size F.  The 4-In-1 cutting block was screwed into place in external rotation matching the posterior condylar angle, making our cuts perpendicular to the epicondylar axis.  Anterior, posterior and chamfer cuts were made with the sagittal saw.  The cutting block and cut pieces were removed.  A lamina spreader was placed in 90 degrees of flexion.  The ACL, PCL, menisci, and posterior condylar osteophytes were removed.  A 10 mm spacer blocked was found to offer  good flexion and extension gap balance after minimal in degree releasing.   The scoop retractor was then placed and the femoral finishing block was pinned in place.  The small sagittal saw was used as well as the lug drill to finish the femur.  The block and cut surfaces were removed and the medullary canal hole filled with autograft bone from the cut pieces.  The tibia was delivered forward in deep flexion and external rotation.   A size 6 tray was selected and pinned into place centered on the medial 1/3 of the tibial tubercle.  The reamer and keel was used to prepare the tibia through the tray.    I then trialed with the size F femur, size 6 tibia, a 10 mm insert and the 36 patella.  I had excellent flexion/extension gap balance, excellent patella tracking.  Flexion was full and beyond 120 degrees; extension was zero.  These components were chosen and the staff opened them to me on the back table while the knee was lavaged copiously and the cement mixed.  I cemented in the components and removed all excess cement.  The polyethylene tibial component was snapped into place and the knee placed in extension while cement was hardening.  The capsule was infilltrated with 20cc of .25% Marcaine with epinephrine.  A hemovac was place in the joint exiting superolaterally.  A pain pump was place superomedially superficial to the arthrotomy.  Once the cement was hard, the tourniquet was let down.  Hemostasis was obtained.  The arthrotomy was closed with figure-8 #1 vicryl sutures.  The deep soft tissues were closed with #0 vicryls and the subcuticular layer closed with a running #2-0 vicryl.  The skin was reapproximated and closed with skin staples.  The wound was dressed with xeroform, 4 x4's, 2 ABD sponges, a single layer of webril and a TED stocking.   The patient was then awakened, extubated, and taken to the recovery room in stable condition.  BLOOD LOSS:  300cc DRAINS: 1 hemovac, 1 pain catheter COMPLICATIONS:  None.  PLAN OF CARE: Admit for overnight observation  PATIENT DISPOSITION:  PACU - hemodynamically stable.   Delay start of Pharmacological VTE agent (>24hrs) due to surgical blood loss or risk of bleeding:  not applicable  Please fax a copy of this op note to my office at [phone] (please only include page 1 and 2 of the Case Information op note)

## 2013-11-30 IMAGING — CR DG CHEST 2V
2 series · 2 of 2 positions shown · non-contrast
Comparison: 05/15/2011

CLINICAL DATA: Coronary disease, hypertension, preop evaluation for
knee replacement

CHEST - 2 VIEW

[view not recorded (1 of 2)]
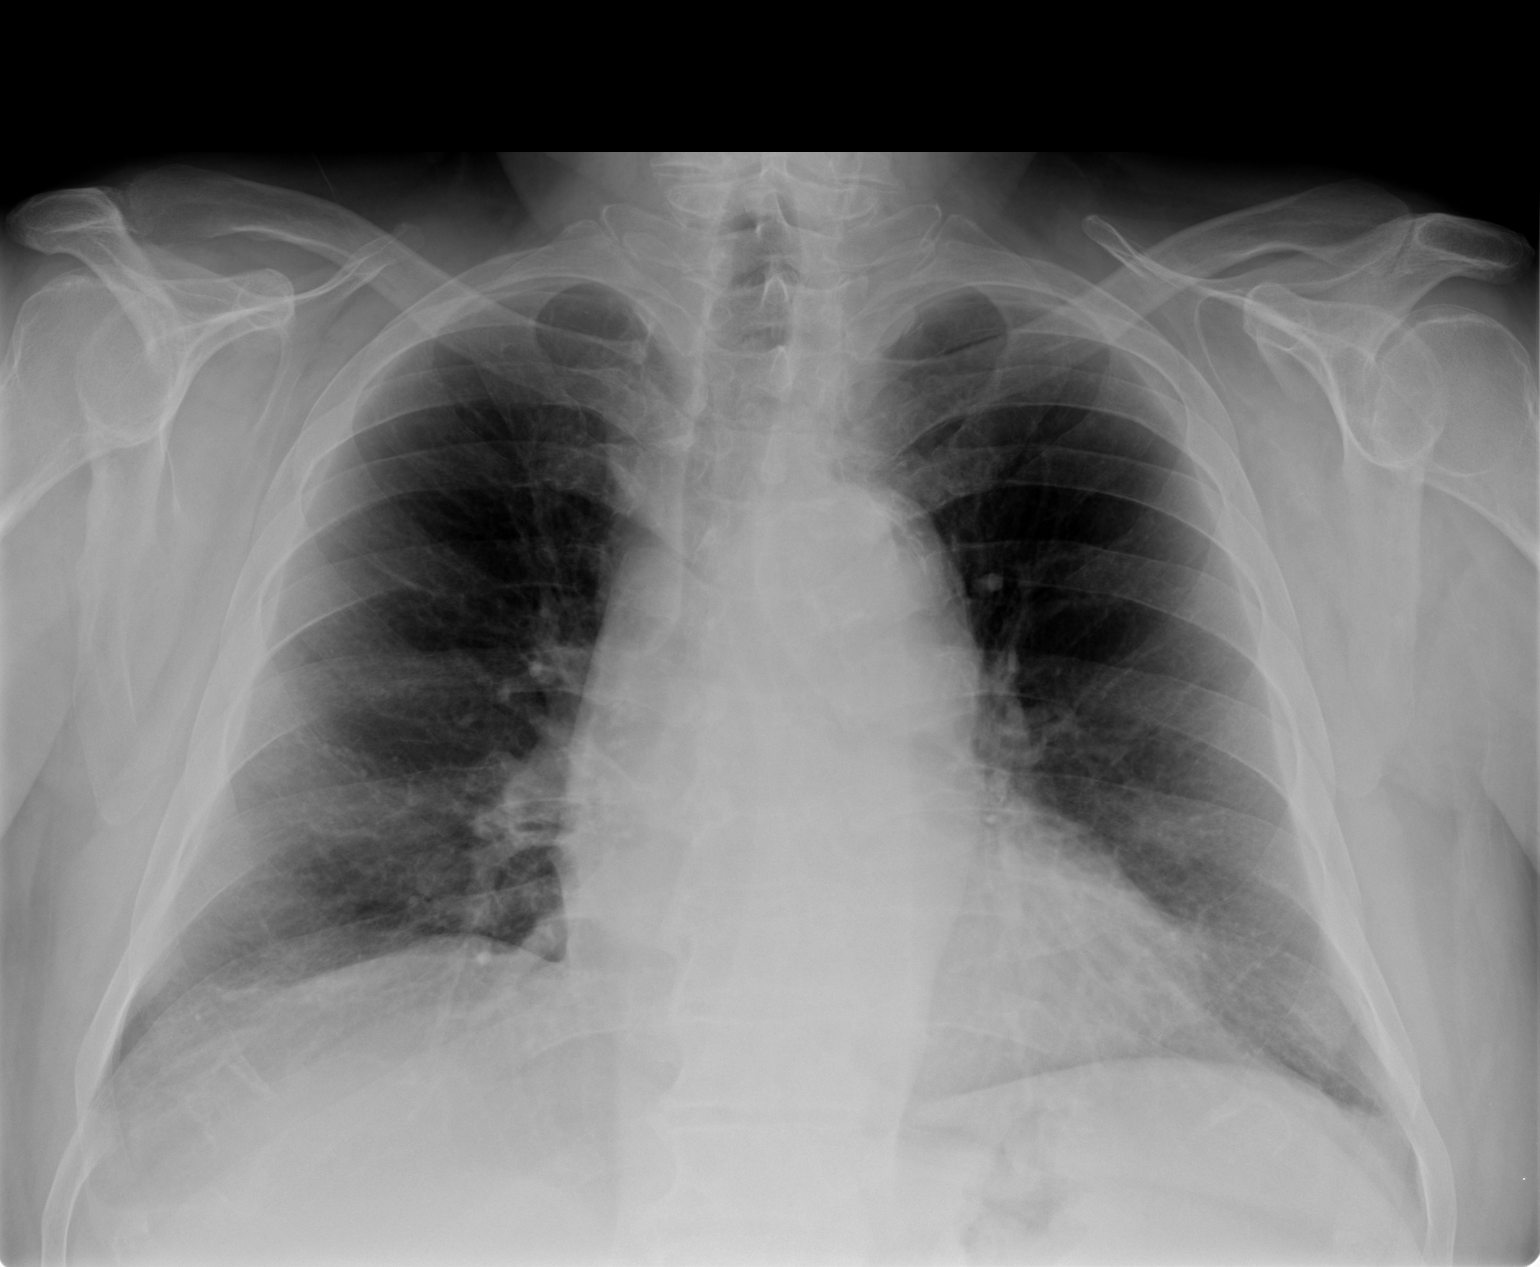

[view not recorded (2 of 2)]
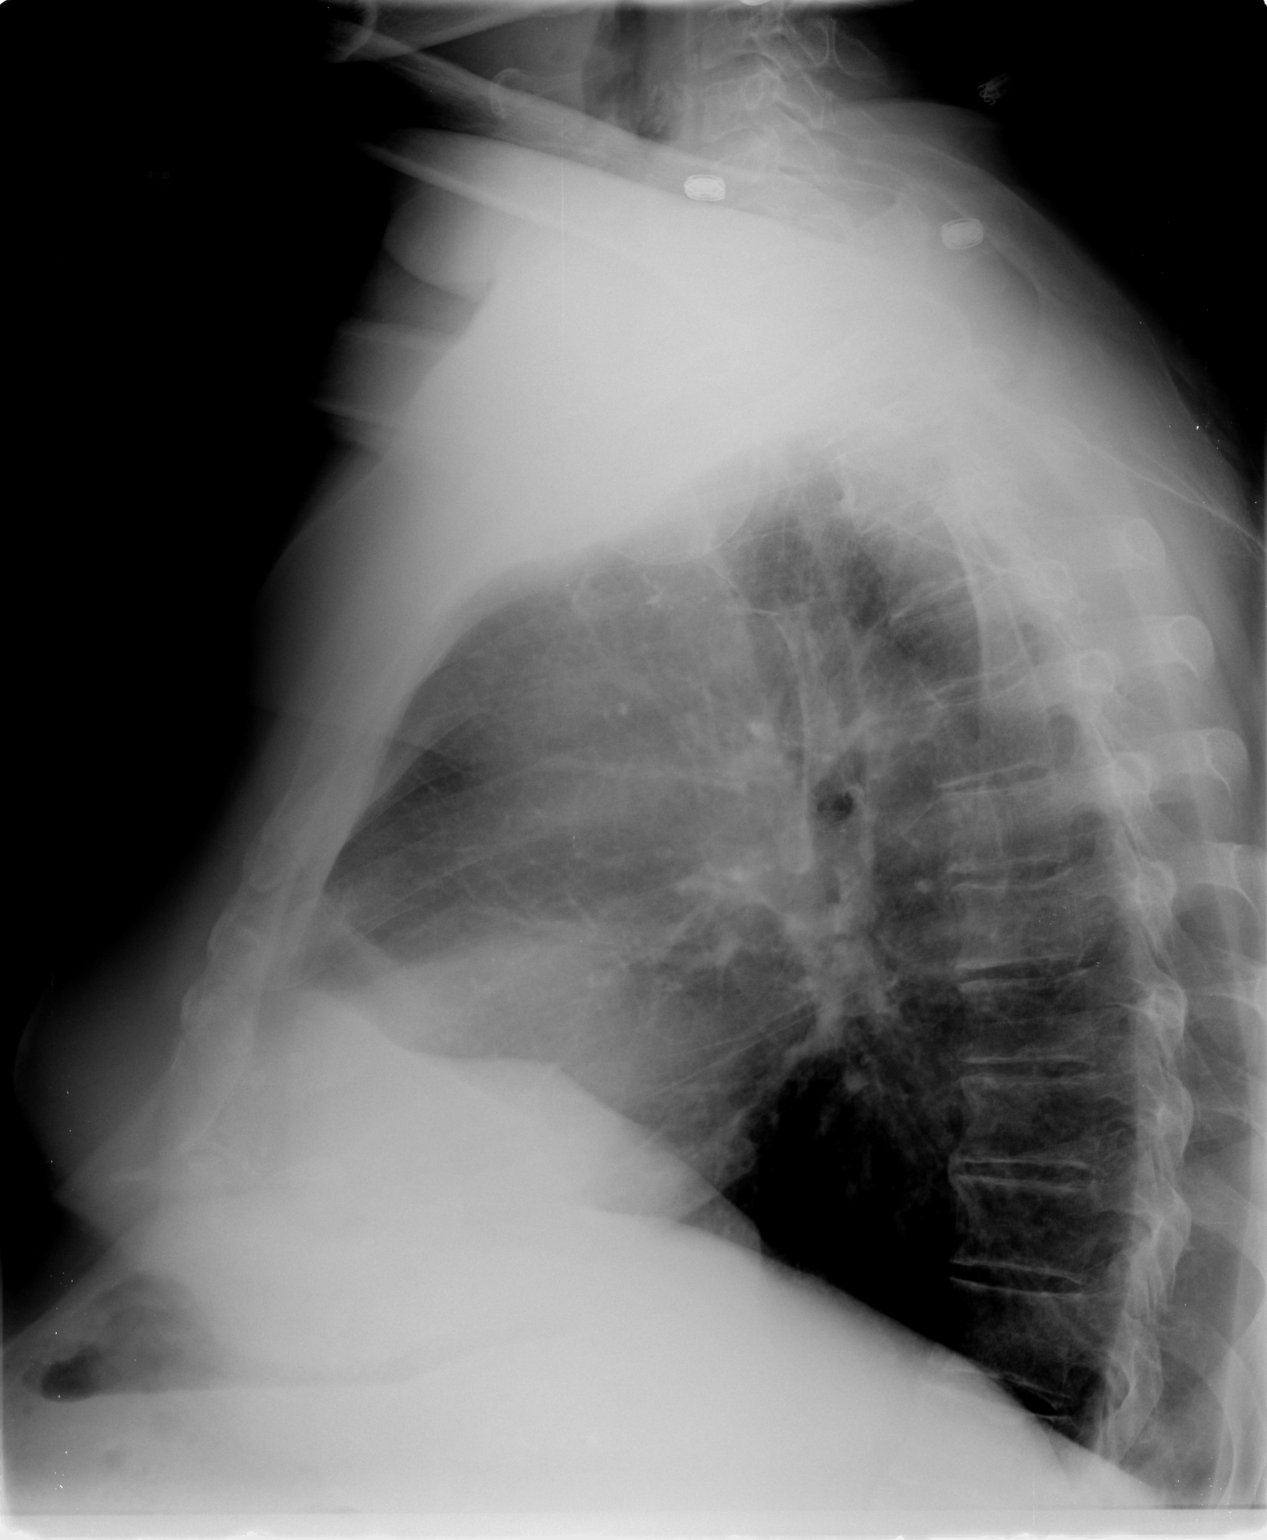

[2 of 2 positions shown; findings below may reference images not displayed]

FINDINGS: Stable cardiac enlargement with normal vascularity.  No
CHF, pneumonia, collapse, consolidation, effusion or pneumothorax.
Atherosclerotic ectatic thoracic aorta evident.  No significant
interval change.
IMPRESSION: Cardiomegaly without CHF or pneumonia.

Atherosclerotic ectatic thoracic aorta.

## 2017-11-11 ENCOUNTER — Other Ambulatory Visit: Payer: Self-pay

## 2017-11-11 DIAGNOSIS — I714 Abdominal aortic aneurysm, without rupture, unspecified: Secondary | ICD-10-CM

## 2018-01-07 ENCOUNTER — Ambulatory Visit (INDEPENDENT_AMBULATORY_CARE_PROVIDER_SITE_OTHER): Payer: Medicare Other | Admitting: Vascular Surgery

## 2018-01-07 ENCOUNTER — Ambulatory Visit (HOSPITAL_COMMUNITY)
Admission: RE | Admit: 2018-01-07 | Discharge: 2018-01-07 | Disposition: A | Discharge status: Home / Self Care | Payer: Medicare Other | Source: Ambulatory Visit | Attending: Vascular Surgery | Admitting: Vascular Surgery

## 2018-01-07 ENCOUNTER — Other Ambulatory Visit: Payer: Self-pay

## 2018-01-07 ENCOUNTER — Encounter: Payer: Self-pay | Admitting: Vascular Surgery

## 2018-01-07 VITALS — BP 151/80 | HR 49 | Temp 98.2°F | Resp 18 | Ht 66.0 in | Wt 226.0 lb

## 2018-01-07 DIAGNOSIS — I714 Abdominal aortic aneurysm, without rupture, unspecified: Secondary | ICD-10-CM

## 2018-01-07 NOTE — Progress Notes (Signed)
Referring Physician: Dr Sabra Heckyan Sanford  Patient name: [name] MRN: [id number] DOB: 11/30/1930 Sex: male  REASON FOR CONSULT: Abdominal aortic aneurysm  HPI: [name] is a 84 y.o. male with a known abdominal aortic aneurysm.  He wished to transfer care to our practice to be closer to home.  He had previously been followed at [facility].  [place]  He has family history of aneurysm and had a brother that died of a ruptured abdominal aortic aneurysm.  The patient has known of his aneurysm since 2013.  It was 4.3 cm diameter at that point.  He denies any abdominal or back pain currently.  Chronic medical problems include renal dysfunction with the most recent serum creatinine that I was able to obtain a 3.8.  He is followed by Dr. Marisue HumbleSanford for this.  He states his renal function has been stable.  He also has a history of coronary artery disease hyperlipidemia and hypertension.  All of these are currently stable.    Past Medical History:  Diagnosis Date  . AAA (abdominal aortic aneurysm) (HCC)    by Dr. Dulce SellarMunley notes, 4.3 cm by U/S 05/2012  . Arthritis   . CAD (coronary artery disease)   . Chronic kidney disease    followed by dr. fox for kidney function  . Coronary artery disease   . GERD (gastroesophageal reflux disease)    ulcer  . Hyperlipemia   . Hypertension   . Myocardial infarction (HCC) 1982   Past Surgical History:  Procedure Laterality Date  . CHOLECYSTECTOMY    . EYE SURGERY     bilateral cataracts  . FINGER AMPUTATION    . FINGER SURGERY     on right hand from trauma as child  . FRACTURE SURGERY Left    as a child  . kidney stents    . TOTAL KNEE ARTHROPLASTY  06/21/2012   Procedure: TOTAL KNEE ARTHROPLASTY;  Surgeon: Dannielle HuhSteve Lucey, MD;  Location: [facility] OR;  Service: Orthopedics;  Laterality: Left;  . TOTAL KNEE ARTHROPLASTY Right 01/03/2013   Procedure: TOTAL KNEE ARTHROPLASTY;  Surgeon: Dannielle HuhSteve Lucey, MD;  Location: [facility] OR;  Service: Orthopedics;   Laterality: Right;    Family History  Problem Relation Age of Onset  . Heart disease Father     SOCIAL HISTORY: Social History   Socioeconomic History  . Marital status: Married    Spouse name: Not on file  . Number of children: Not on file  . Years of education: Not on file  . Highest education level: Not on file  Occupational History  . Not on file  Social Needs  . Financial resource strain: Not on file  . Food insecurity:    Worry: Not on file    Inability: Not on file  . Transportation needs:    Medical: Not on file    Non-medical: Not on file  Tobacco Use  . Smoking status: Never Smoker  . Smokeless tobacco: Never Used  Substance and Sexual Activity  . Alcohol use: No  . Drug use: No  . Sexual activity: Not on file  Lifestyle  . Physical activity:    Days per week: Not on file    Minutes per session: Not on file  . Stress: Not on file  Relationships  . Social connections:    Talks on phone: Not on file    Gets together: Not on file    Attends religious service: Not on file  Active member of club or organization: Not on file    Attends meetings of clubs or organizations: Not on file    Relationship status: Not on file  . Intimate partner violence:    Fear of current or ex partner: Not on file    Emotionally abused: Not on file    Physically abused: Not on file    Forced sexual activity: Not on file  Other Topics Concern  . Not on file  Social History Narrative  . Not on file    Allergies  Allergen Reactions  . Ace Inhibitors   . Plavix [Clopidogrel]     hives    Current Outpatient Medications  Medication Sig Dispense Refill  . aspirin EC 81 MG tablet Take by mouth.    . folic acid (FOLVITE) 400 MCG tablet Take by mouth.    Marland Kitchen allopurinol (ZYLOPRIM) 100 MG tablet Take 100 mg by mouth 2 (two) times daily.    Marland Kitchen atenolol (TENORMIN) 100 MG tablet Take 50 mg by mouth daily.     Marland Kitchen atorvastatin (LIPITOR) 80 MG tablet Take 80 mg by mouth every  evening.    Marland Kitchen b complex vitamins capsule Take 1 capsule by mouth daily.    . celecoxib (CELEBREX) 200 MG capsule Take 1 capsule (200 mg total) by mouth every 12 (twelve) hours. 60 capsule 2  . enoxaparin (LOVENOX) 40 MG/0.4ML injection Inject 0.4 mLs (40 mg total) into the skin daily. 12 Syringe 0  . ezetimibe (ZETIA) 10 MG tablet Take 10 mg by mouth at bedtime.    . Fe Fum-FePoly-FA-Vit C-Vit B3 (INTEGRA F) 125-1 MG CAPS Take 1 capsule by mouth daily.    . furosemide (LASIX) 40 MG tablet Take 20 mg by mouth daily.    . hydrochlorothiazide (HYDRODIURIL) 25 MG tablet Take 25 mg by mouth daily.    . isosorbide-hydrALAZINE (BIDIL) 20-37.5 MG per tablet Take 1 tablet by mouth daily.    . methocarbamol (ROBAXIN) 500 MG tablet Take 1 tablet (500 mg total) by mouth every 6 (six) hours as needed. 60 tablet 2  . Multiple Vitamins-Minerals (CENTRUM SILVER PO) Take 1 tablet by mouth daily.    Marland Kitchen omeprazole (PRILOSEC) 20 MG capsule Take 20 mg by mouth daily.    Marland Kitchen oxyCODONE (OXY IR/ROXICODONE) 5 MG immediate release tablet Take 1-2 tablets (5-10 mg total) by mouth every 3 (three) hours as needed. 90 tablet 0  . OxyCODONE (OXYCONTIN) 20 mg T12A Take 1 tablet (20 mg total) by mouth every 12 (twelve) hours. 30 tablet 0   No current facility-administered medications for this visit.     ROS:   General:  No weight loss, Fever, chills  HEENT: No recent headaches, no nasal bleeding, no visual changes, no sore throat  Neurologic: No dizziness, blackouts, seizures. No recent symptoms of stroke or mini- stroke. No recent episodes of slurred speech, or temporary blindness.  Cardiac: No recent episodes of chest pain/pressure, no shortness of breath at rest.  + shortness of breath with exertion.  Denies history of atrial fibrillation or irregular heartbeat  Vascular: No history of rest pain in feet.  No history of claudication.  No history of non-healing ulcer, No history of DVT   Pulmonary: No home oxygen, no  productive cough, no hemoptysis,  No asthma or wheezing  Musculoskeletal:  [X]  Arthritis, [ ]  Low back pain,  [X]  Joint pain  Hematologic:No history of hypercoagulable state.  No history of easy bleeding.  No history of anemia  Gastrointestinal: No hematochezia or melena,  No gastroesophageal reflux, no trouble swallowing  Urinary: [X]  chronic Kidney disease, [ ]  on HD - [ ]  MWF or [ ]  TTHS, [ ]  Burning with urination, [ ]  Frequent urination, [ ]  Difficulty urinating;   Skin: No rashes  Psychological: No history of anxiety,  No history of depression   Physical Examination  Vitals:   01/07/18 0929 01/07/18 0930  BP: (!) 160/82 (!) 151/80  Pulse: (!) 49   Resp: 18   Temp: 98.2 F (36.8 C)   TempSrc: Oral   SpO2: 98%   Weight: 226 lb (102.5 kg)   Height: 5\' 6"  (1.676 m)     Body mass index is 36.48 kg/m.  General:  Alert and oriented, no acute distress HEENT: Normal Neck: No bruit or JVD Pulmonary: Clear to auscultation bilaterally Cardiac: Regular Rate and Rhythm without murmur Abdomen: Soft, non-tender, non-distended, no mass, no scars, obese Skin: No Kawecki Extremity Pulses:  2+ radial, brachial, femoral, dorsalis pedis, posterior tibial pulses bilaterally Musculoskeletal: No deformity or edema  Neurologic: Upper and lower extremity motor 5/5 and symmetric  DATA:  ultrasound of the abdominal aorta today.  This showed a 5 cm infrarenal abdominal aortic aneurysm.  I reviewed and interpreted the study.  ASSESSMENT: Asymptomatic infrarenal abdominal aortic aneurysm and an 84 year old with fairly significant renal dysfunction.  Discussed with patient today risk of rupture of abdominal aortic aneurysm 5 cm in diameter.  This should be less than 2.5 %/year.  I discussed the patient the possibility today of getting a noncontrast CT scan to precisely determine diameter.  However, he would prefer at this point to just repeat the ultrasound in 6 months.  I believe overall this is a  reasonable choice.  PLAN: Signs symptoms of ruptured aneurysm were discussed with the patient today.  The patient will return for follow-up in 6 months time with a repeat aortic ultrasound and see our nurse practitioner.  If this remains 5 to 5-1/2 cm in diameter continues to grow we will again discuss whether or not the patient should have a CT scan of the abdomen and pelvis.  This will have to be a noncontrast CT due to his renal dysfunction.   Fabienne Bruns, MD [facility] Office: [phone] Pager: [phone]

## 2018-01-20 ENCOUNTER — Other Ambulatory Visit: Payer: Self-pay

## 2018-01-20 DIAGNOSIS — I714 Abdominal aortic aneurysm, without rupture, unspecified: Secondary | ICD-10-CM

## 2018-07-15 ENCOUNTER — Ambulatory Visit (HOSPITAL_COMMUNITY)
Admission: RE | Admit: 2018-07-15 | Discharge: 2018-07-15 | Disposition: A | Discharge status: Home / Self Care | Payer: Medicare Other | Source: Ambulatory Visit | Attending: Family Medicine | Admitting: Family Medicine

## 2018-07-15 ENCOUNTER — Encounter: Payer: Self-pay | Admitting: Family

## 2018-07-15 ENCOUNTER — Ambulatory Visit (INDEPENDENT_AMBULATORY_CARE_PROVIDER_SITE_OTHER): Payer: Medicare Other | Admitting: Family

## 2018-07-15 VITALS — BP 107/57 | HR 44 | Temp 96.8°F | Resp 14 | Ht 66.0 in | Wt 227.0 lb

## 2018-07-15 DIAGNOSIS — I714 Abdominal aortic aneurysm, without rupture, unspecified: Secondary | ICD-10-CM

## 2018-07-15 DIAGNOSIS — R0989 Other specified symptoms and signs involving the circulatory and respiratory systems: Secondary | ICD-10-CM

## 2018-07-15 NOTE — Progress Notes (Signed)
[facility] [place]   CC: Follow up Abdominal Aortic Aneurysm  History of Present Illness  [name] is a 84 y.o. (Feb 19, 1931) male whom Dr. Darrick Penna has been monitoring with a known abdominal aortic aneurysm.   He wished to transfer care to our practice to be closer to home.  He had previously been followed at [facility].  He lives in [place].   He has family history of aneurysm and had a brother that died of a ruptured abdominal aortic aneurysm.  The patient has known of his aneurysm since 2013.  It was 4.3 cm diameter at that point.    Chronic medical problems include renal dysfunction with the most recent serum creatinine that I was able to obtain at 3.8.   He is followed by Dr. Marisue Humble for this, pt states his kid functions has improved. He also has a history of coronary artery disease hyperlipidemia and hypertension.  All of these are currently stable.    Dr. Darrick Penna last evaluated pt on 01-07-18. At that time abdominal duplex showed a 5 cm infrarenal abdominal aortic aneurysm.  Asymptomatic infrarenal abdominal aortic aneurysm and an 84 year old at that time with fairly significant renal dysfunction.  Dr. Darrick Penna discussed with patient that day risk of rupture of abdominal aortic aneurysm 5 cm in diameter.  This should be less than 2.5 %/year.  He also discussed with the patient the possibility of getting a noncontrast CT scan to precisely determine diameter.  However, he would prefer at that point to just repeat the ultrasound in 6 months.  Dr. Darrick Penna believed overall this is a reasonable choice. Signs symptoms of ruptured aneurysm were discussed with the patient that day.  The patient was to return for follow-up in 6 months time with a repeat aortic ultrasound and see our nurse practitioner.  If this remains 5 to 5-1/2 cm in diameter continues to grow we will again discuss whether or not the patient should have a CT scan of the abdomen and pelvis.  This will have to  be a noncontrast CT due to his renal dysfunction.  He had an MI in 1980, no intervention. He had bilateral renal artery stenting in [facility]; he states his blood pressure was in the 200's systolic prior to this.  He takes a daily ASA, beta blocker, and statin.   He denies any abdominal pain or back pain.   The patient denies claudication type symptoms in his legs with walking. The patient denies any history of stroke or TIA symptoms.  He reports recently accidentally scraping his face on something, in addition he had a couple areas of skin cancer removed.   Diabetic: Yes Tobacco use: non-smoker  Past Medical History:  Diagnosis Date  . AAA (abdominal aortic aneurysm) (HCC)    by Dr. Dulce Sellar notes, 4.3 cm by U/S 05/2012  . Arthritis   . CAD (coronary artery disease)   . Chronic kidney disease    followed by dr. fox for kidney function  . Coronary artery disease   . GERD (gastroesophageal reflux disease)    ulcer  . Hyperlipemia   . Hypertension   . Myocardial infarction (HCC) 1982   Past Surgical History:  Procedure Laterality Date  . CHOLECYSTECTOMY    . EYE SURGERY     bilateral cataracts  . FINGER AMPUTATION    . FINGER SURGERY     on right hand from trauma as child  . FRACTURE SURGERY Left    as  a child  . kidney stents    . TOTAL KNEE ARTHROPLASTY  06/21/2012   Procedure: TOTAL KNEE ARTHROPLASTY;  Surgeon: Dannielle Huh, MD;  Location: [facility] OR;  Service: Orthopedics;  Laterality: Left;  . TOTAL KNEE ARTHROPLASTY Right 01/03/2013   Procedure: TOTAL KNEE ARTHROPLASTY;  Surgeon: Dannielle Huh, MD;  Location: [facility] OR;  Service: Orthopedics;  Laterality: Right;   Social History Social History   Socioeconomic History  . Marital status: Married    Spouse name: Not on file  . Number of children: Not on file  . Years of education: Not on file  . Highest education level: Not on file  Occupational History  . Not on file  Social Needs  . Financial resource strain: Not on file   . Food insecurity:    Worry: Not on file    Inability: Not on file  . Transportation needs:    Medical: Not on file    Non-medical: Not on file  Tobacco Use  . Smoking status: Never Smoker  . Smokeless tobacco: Never Used  Substance and Sexual Activity  . Alcohol use: No  . Drug use: No  . Sexual activity: Not on file  Lifestyle  . Physical activity:    Days per week: Not on file    Minutes per session: Not on file  . Stress: Not on file  Relationships  . Social connections:    Talks on phone: Not on file    Gets together: Not on file    Attends religious service: Not on file    Active member of club or organization: Not on file    Attends meetings of clubs or organizations: Not on file    Relationship status: Not on file  . Intimate partner violence:    Fear of current or ex partner: Not on file    Emotionally abused: Not on file    Physically abused: Not on file    Forced sexual activity: Not on file  Other Topics Concern  . Not on file  Social History Narrative  . Not on file   Family History Family History  Problem Relation Age of Onset  . Heart disease Father     Current Outpatient Medications on File Prior to Visit  Medication Sig Dispense Refill  . allopurinol (ZYLOPRIM) 100 MG tablet Take 100 mg by mouth 2 (two) times daily.    Marland Kitchen aspirin EC 81 MG tablet Take by mouth.    Marland Kitchen atenolol (TENORMIN) 100 MG tablet Take 25 mg by mouth daily.     Marland Kitchen atorvastatin (LIPITOR) 80 MG tablet Take 10 mg by mouth every evening.     Marland Kitchen b complex vitamins capsule Take 1 capsule by mouth daily.    . celecoxib (CELEBREX) 200 MG capsule Take 1 capsule (200 mg total) by mouth every 12 (twelve) hours. 60 capsule 2  . enoxaparin (LOVENOX) 40 MG/0.4ML injection Inject 0.4 mLs (40 mg total) into the skin daily. 12 Syringe 0  . ezetimibe (ZETIA) 10 MG tablet Take 10 mg by mouth at bedtime.    . Fe Fum-FePoly-FA-Vit C-Vit B3 (INTEGRA F) 125-1 MG CAPS Take 1 capsule by mouth daily.    .  folic acid (FOLVITE) 400 MCG tablet Take by mouth.    . furosemide (LASIX) 40 MG tablet Take 20 mg by mouth daily.    . hydrochlorothiazide (HYDRODIURIL) 25 MG tablet Take 25 mg by mouth daily.    . isosorbide dinitrate (ISORDIL) 20 MG tablet Take 20  mg by mouth 3 (three) times daily.    . isosorbide-hydrALAZINE (BIDIL) 20-37.5 MG per tablet Take 1 tablet by mouth daily.    . methocarbamol (ROBAXIN) 500 MG tablet Take 1 tablet (500 mg total) by mouth every 6 (six) hours as needed. 60 tablet 2  . Multiple Vitamins-Minerals (CENTRUM SILVER PO) Take 1 tablet by mouth daily.    Marland Kitchen omeprazole (PRILOSEC) 20 MG capsule Take 20 mg by mouth daily.    Marland Kitchen oxyCODONE (OXY IR/ROXICODONE) 5 MG immediate release tablet Take 1-2 tablets (5-10 mg total) by mouth every 3 (three) hours as needed. 90 tablet 0  . OxyCODONE (OXYCONTIN) 20 mg T12A Take 1 tablet (20 mg total) by mouth every 12 (twelve) hours. 30 tablet 0   No current facility-administered medications on file prior to visit.    Allergies  Allergen Reactions  . Ace Inhibitors   . Plavix [Clopidogrel]     hives    ROS: See HPI for pertinent positives and negatives.  Physical Examination  Vitals:   07/15/18 0928 07/15/18 0936  BP: (!) 129/53 (!) 107/57  Pulse: (!) 44   Resp: 14   Temp: (!) 96.8 F (36 C)   SpO2: 95%   Weight: 227 lb (103 kg)   Height: 5\' 6"  (1.676 m)    Body mass index is 36.64 kg/m.  General: A&O x 3, WD, obese elderly male. HEENT: Grossly intact and WNL. Several small healing lesions on face.  Pulmonary: Sym exp, respirations are non labored, good air movt, CTAB, no rales, rhonchi, or wheezing. Cardiac: Regular rhythm, bradycardic (on a beta blocker), no detected murmur.  Carotid Bruits Right Left   Negative Positive   Adominal aortic pulse is not palpable Radial pulses are 2+ palpable bilaterally.                           VASCULAR EXAM:                                                                                                          LE Pulses Right Left       FEMORAL  3+ palpable  3+ palpable        POPLITEAL  not palpable   not palpable       POSTERIOR TIBIAL  not palpable   not palpable        DORSALIS PEDIS      ANTERIOR TIBIAL 2+ palpable  not palpable     Gastrointestinal: soft, NTND, -G/R, - HSM, asymptomatic, reducible + ventral hernia with use of abdominal muscles, - CVAT B. Musculoskeletal: M/S 5/5 throughout, Extremities without ischemic changes. Skin: No rashes, no ulcers, no cellulitis.   Neurologic: CN 2-12 intact, Pain and light touch intact in extremities are intact, Motor exam as listed above. Psychiatric: Normal thought content, mood appropriate to clinical situation.    DATA  AAA Duplex (07/15/2018): Abdominal Aorta Findings:  +-----------+-------+----------+----------+--------+--------+--------+  Location  AP (cm)Trans (cm)PSV (cm/s)WaveformThrombusComments  +-----------+-------+----------+----------+--------+--------+--------+  Proximal  1.90  61                  +-----------+-------+----------+----------+--------+--------+--------+  Mid    4.90  5.20   50            fusiform  +-----------+-------+----------+----------+--------+--------+--------+  Distal   2.60  2.60   62                  +-----------+-------+----------+----------+--------+--------+--------+  RT CIA Prox1.2  1.3    162                  +-----------+-------+----------+----------+--------+--------+--------+  Visualization of the Proximal Abdominal Aorta, Left CIA Proximal artery and Right CIA Proximal artery was limited.      Summary:  Abdominal Aorta: There is evidence of abnormal dilitation of the Mid Abdominal aorta. The largest aortic measurement is 5.2 cm. The largest aortic diameter remains essentially unchanged compared to prior exam. Previous diameter  measurement was 5.0 cm   obtained on 01/07/2018.       Medical Decision Making  The patient is a 84 y.o. male who presents with asymptomatic AAA with 2 mm increase in size, to 5.2 cm today, from 5.0 cm on 01-07-18.   Based on this patient's exam, HPI, and diagnostic studies, and after discussing with Dr. Darrick PennaFields, the patient will follow up in 6 months  with the following studies: AAA duplex and carotid duplex (left carotid bruit, no hx of stroke or TIA).   Consideration for repair of AAA would be made when the size is 5.0 cm, growth > 1 cm/yr, and symptomatic status.        The patient was given information about AAA including signs, symptoms, treatment, and how to minimize the risk of enlargement and rupture of aneurysms.    I emphasized the importance of maximal medical management including strict control of blood pressure, blood glucose, and lipid levels, antiplatelet agents, obtaining regular exercise, and continued  cessation of smoking.   The patient was advised to call 911 should the patient experience sudden onset abdominal or back pain.   Thank you for allowing us to participate in this patient's care.  Charisse MarchSuzanne Cassadie Pankonin, RN, MSN, FNP-C [facility] [place] Office: [phone]  Clinic Physician: Darrick PennaFields  07/15/2018, 9:40 AM

## 2018-07-15 NOTE — Patient Instructions (Signed)
Before your next abdominal ultrasound:  Avoid gas forming foods and beverages the day before the test.   Take two Extra-Strength Gas-X capsules at bedtime the night before the test. Take another two Extra-Strength Gas-X capsules in the middle of the night if you get up to the restroom, if not, first thing in the morning with water.  Do not chew gum.      Abdominal Aortic Aneurysm Blood pumps away from the heart through tubes (blood vessels) called arteries. Aneurysms are weak or damaged places in the wall of an artery. It bulges out like a balloon. An abdominal aortic aneurysm happens in the main artery of the body (aorta). It can burst or tear, causing bleeding inside the body. This is an emergency. It needs treatment right away. What are the causes? The exact cause is unknown. Things that could cause this problem include:  Fat and other substances building up in the lining of a tube.  Swelling of the walls of a blood vessel.  Certain tissue diseases.  Belly (abdominal) trauma.  An infection in the main artery of the body.  What increases the risk? There are things that make it more likely for you to have an aneurysm. These include:  Being over the age of 82 years old.  Having high blood pressure (hypertension).  Being a male.  Being white.  Being very overweight (obese).  Having a family history of aneurysm.  Using tobacco products.  What are the signs or symptoms? Symptoms depend on the size of the aneurysm and how fast it grows. There may not be symptoms. If symptoms occur, they can include:  Pain (belly, side, lower back, or groin).  Feeling full after eating a small amount of food.  Feeling sick to your stomach (nauseous), throwing up (vomiting), or both.  Feeling a lump in your belly that feels like it is beating (pulsating).  Feeling like you will pass out (faint).  How is this treated?  Medicine to control blood pressure and pain.  Imaging tests to  see if the aneurysm gets bigger.  Surgery. How is this prevented? To lessen your chance of getting this condition:  Stop smoking. Stop chewing tobacco.  Limit or avoid alcohol.  Keep your blood pressure, blood sugar, and cholesterol within normal limits.  Eat less salt.  Eat foods low in saturated fats and cholesterol. These are found in animal and whole dairy products.  Eat more fiber. Fiber is found in whole grains, vegetables, and fruits.  Keep a healthy weight.  Stay active and exercise often.  This information is not intended to replace advice given to you by your health care provider. Make sure you discuss any questions you have with your health care provider. Document Released: 11/15/2012 Document Revised: 12/27/2015 Document Reviewed: 08/20/2012 Elsevier Interactive Patient Education  2017 Elsevier Inc.  

## 2019-01-13 ENCOUNTER — Other Ambulatory Visit: Payer: Self-pay

## 2019-01-13 DIAGNOSIS — I714 Abdominal aortic aneurysm, without rupture, unspecified: Secondary | ICD-10-CM

## 2019-01-13 DIAGNOSIS — R0989 Other specified symptoms and signs involving the circulatory and respiratory systems: Secondary | ICD-10-CM

## 2019-01-14 ENCOUNTER — Telehealth (HOSPITAL_COMMUNITY): Payer: Self-pay | Admitting: Rehabilitation

## 2019-01-14 NOTE — Telephone Encounter (Signed)
The above patient or their representative was contacted and gave the following answers to these questions:         Do you have any of the following symptoms? No Fever                    Cough                   Shortness of breath  Do  you have any of the following other symptoms? No  muscle pain         vomiting,        diarrhea        [place]         weakness        red eye        abdominal pain         bruising         bleeding              joint pain           severe headache  Have you been in contact with someone who was or has been sick in the past 2 weeks? No Yes                 Unsure                         Unable to assess   Does the person that you were in contact with have any of the following symptoms?  Cough         shortness of breath           muscle pain         vomiting,            diarrhea            [place]            weakness           fever            red eye           abdominal pain          bruising  or  bleeding                joint pain                severe headache             Have you  or someone you have been in contact with traveled internationally in the last month?  No      If yes, which countries?  Have you  or someone you have been in contact with traveled outside [place] in the last month?  No      If yes, which state and city?  COMMENTS OR ACTION PLAN FOR THIS PATIENT:    

## 2019-01-17 ENCOUNTER — Encounter: Payer: Self-pay | Admitting: Family

## 2019-01-17 ENCOUNTER — Ambulatory Visit (HOSPITAL_COMMUNITY)
Admission: RE | Admit: 2019-01-17 | Discharge: 2019-01-17 | Disposition: A | Discharge status: Home / Self Care | Payer: Medicare Other | Source: Ambulatory Visit | Attending: Family | Admitting: Family

## 2019-01-17 ENCOUNTER — Other Ambulatory Visit: Payer: Self-pay

## 2019-01-17 ENCOUNTER — Ambulatory Visit (INDEPENDENT_AMBULATORY_CARE_PROVIDER_SITE_OTHER)
Admission: RE | Admit: 2019-01-17 | Discharge: 2019-01-17 | Disposition: A | Discharge status: Home / Self Care | Payer: Medicare Other | Source: Ambulatory Visit | Attending: Family | Admitting: Family

## 2019-01-17 ENCOUNTER — Ambulatory Visit (INDEPENDENT_AMBULATORY_CARE_PROVIDER_SITE_OTHER): Payer: Medicare Other | Admitting: Family

## 2019-01-17 VITALS — BP 168/86 | HR 81 | Temp 97.0°F | Resp 16 | Ht 67.0 in | Wt 230.0 lb

## 2019-01-17 DIAGNOSIS — R0989 Other specified symptoms and signs involving the circulatory and respiratory systems: Secondary | ICD-10-CM

## 2019-01-17 DIAGNOSIS — I714 Abdominal aortic aneurysm, without rupture, unspecified: Secondary | ICD-10-CM

## 2019-01-17 DIAGNOSIS — I6523 Occlusion and stenosis of bilateral carotid arteries: Secondary | ICD-10-CM | POA: Diagnosis not present

## 2019-01-17 NOTE — Patient Instructions (Addendum)
Abdominal Aortic Aneurysm    An aneurysm is a bulge in one of the blood vessels that carry blood away from the heart (artery). It happens when blood pushes up against a weak or damaged place in the wall of an artery. An abdominal aortic aneurysm happens in the main artery of the body (aorta).  Some aneurysms may not cause problems. If it grows, it can burst or tear, causing bleeding inside the body. This is an emergency. It needs to be treated right away.  What are the causes?  The exact cause of this condition is not known.  What increases the risk?  The following may make you more likely to get this condition:   Being a male who is 60 years of age or older.   Being white (Caucasian).   Using tobacco.   Having a family history of aneurysms.   Having the following conditions:  ? Hardening of the arteries (arteriosclerosis).  ? Inflammation of the walls of an artery (arteritis).  ? Certain genetic conditions.  ? Being very overweight (obesity).  ? An infection in the wall of the aorta (infectious aortitis).  ? High cholesterol.  ? High blood pressure (hypertension).  What are the signs or symptoms?  Symptoms depend on the size of the aneurysm and how fast it is growing. Most grow slowly and do not cause any symptoms. If symptoms do occur, they may include:   Pain in the belly (abdomen), side, or back.   Feeling full after eating only small amounts of food.   Feeling a throbbing lump in the belly.  Symptoms that the aneurysm has burst (ruptured) include:   Sudden, very bad pain in the belly, side, or back.   Feeling sick to your stomach (nauseous).   Throwing up (vomiting).   Feeling light-headed or passing out.  How is this treated?  Treatment for this condition depends on:   The size of the aneurysm.   How fast it is growing.   Your age.   Your risk of having it burst.  If your aneurysm is smaller than 2 inches (5 cm), your doctor may manage it by:   Checking it often to see if it is getting bigger.  You may have an imaging test (ultrasound) to check it every 3-6 months, every year, or every few years.   Giving you medicines to:  ? Control blood pressure.  ? Treat pain.  ? Fight infection.  If your aneurysm is larger than 2 inches (5 cm), you may need surgery to fix it.  Follow these instructions at home:  Lifestyle   Do not use any products that have nicotine or tobacco in them. This includes cigarettes, e-cigarettes, and chewing tobacco. If you need help quitting, ask your doctor.   Get regular exercise. Ask your doctor what types of exercise are best for you.  Eating and drinking   Eat a heart-healthy diet. This includes eating plenty of:  ? Fresh fruits and vegetables.  ? Whole grains.  ? Low-fat (lean) protein.  ? Low-fat dairy products.   Avoid foods that are high in saturated fat and cholesterol. These foods include red meat and some dairy products.   Do not drink alcohol if:  ? Your doctor tells you not to drink.  ? You are pregnant, may be pregnant, or are planning to become pregnant.   If you drink alcohol:  ? Limit how much you use to:   0-1 drink a day for women.     0-2 drinks a day for men.  ? Be aware of how much alcohol is in your drink. In the [place] one drink equals any of these:   One typical bottle of beer (12 oz).   One-half glass of wine (5 oz).   One shot of hard liquor (1 oz).  General instructions   Take over-the-counter and prescription medicines only as told by your doctor.   Keep your blood pressure within normal limits. Ask your doctor what your blood pressure should be.   Have your blood sugar (glucose) level and cholesterol levels checked regularly. Keep your blood sugar level and cholesterol levels within normal limits.   Avoid heavy lifting and activities that take a lot of effort. Ask your doctor what activities are safe for you.   Keep all follow-up visits as told by your doctor. This is important.  ? Talk to your doctor about regular screenings to see if the  aneurysm is getting bigger.  Contact a doctor if you:   Have pain in your belly, side, or back.   Have a throbbing feeling in your belly.   Have a family history of aneurysms.  Get help right away if you:   Have sudden, bad pain in your belly, side, or back.   Feel sick to your stomach.   Throw up.   Have trouble pooping (constipation).   Have trouble peeing (urinating).   Feel light-headed.   Have a fast heart rate when you stand.   Have sweaty skin that is cold to the touch (clammy).   Have shortness of breath.   Have a fever.  These symptoms may be an emergency. Do not wait to see if the symptoms will go away. Get medical help right away. Call your local emergency services (911 in the [place] Do not drive yourself to the hospital.  Summary   An aneurysm is a bulge in one of the blood vessels that carry blood away from the heart (artery). Some aneurysms may not cause problems.   You may need to have yours checked often. If it grows, it can burst or tear. This causes bleeding inside the body. It needs to be treated right away.   Follow instructions from your doctor about healthy lifestyle changes.   Keep all follow-up visits as told by your doctor. This is important.  This information is not intended to replace advice given to you by your health care provider. Make sure you discuss any questions you have with your health care provider.  Document Released: 11/15/2012 Document Revised: 02/27/2018 Document Reviewed: 02/27/2018  Elsevier Interactive Patient Education  2019 Elsevier Inc.

## 2019-01-17 NOTE — Progress Notes (Signed)
[facility] [place] HISTORY AND PHYSICAL   CC: Follow up AAA and extracranial carotid artery stenosis    History of Present Illness:   [name] is a 85 y.o. male whom Dr. Darrick PennaFields has been monitoring with a known abdominal aortic aneurysm.  He wished to transfer care to our practice to be closer to home. He had previously been followed [facility]. He lives in [place].  He has family history of aneurysm and had a brother that died of a ruptured abdominal aortic aneurysm. The patient has known of his aneurysm since 2013. It was 4.3 cm diameter at that point.   Chronic medical problems include renal dysfunction. He is followed by Dr. Marisue HumbleSanford for this. He also has a history of coronary artery disease hyperlipidemia and hypertension. All of these are currently stable.    Dr. Darrick PennaFields last evaluated pt on 01-07-18. At that time abdominal duplex showed a 5 cm infrarenal abdominal aortic aneurysm.  Asymptomatic infrarenal abdominal aortic aneurysm and an 85 year old at that time with fairly significant renal dysfunction. Dr. Darrick PennaFields discussed with patient that day risk of rupture of abdominal aortic aneurysm 5 cm in diameter. This should be less than 2.5 %/year. He also discussed with the patient the possibility of getting a noncontrast CT scan to precisely determine diameter. However, he would prefer at that point to just repeat the ultrasound in 6 months. Dr. Darrick PennaFields believed overall this is a reasonable choice. Signs symptoms of ruptured aneurysm were discussed with the patient that day. The patient was to return for follow-up in 6 months time with a repeat aortic ultrasound and see our nurse practitioner. If this remains 5 to 5-1/2 cm in diameter continues to grow we will again discuss whether or not the patient should have a CT scan of the abdomen and pelvis. This will have to be a noncontrast CT due to his renal dysfunction.  He had an MI in 1980,  no intervention. He had bilateral renal artery stenting in [facility]; he states his blood pressure was in the 200's systolic prior to this.   He denies any abdominal pain or back pain.    He denies claudication type symptoms in his legs with walking. He denies any history of stroke or TIA symptoms.  He had a couple areas of skin cancer removed, states he has had a couple of Moh's procedures.   He states that he had an MI in April 2020; his cardiologist is Dr. Dulce SellarMunley in [place], and more recetly Dr. Judithe Mod[facility]Folk in [facility]. He states that he is scheduled for a pacemaker insertion on 01-24-19 by Dr. Christel MormonAkbert.   Diabetic: no Tobacco use: non-smoker  His medications include Eliquis, 81 mg daily ASA, metoprolol, and atorvastatin.   Current Outpatient Medications  Medication Sig Dispense Refill  . allopurinol (ZYLOPRIM) 100 MG tablet Take 100 mg by mouth 2 (two) times daily.    Marland Kitchen. amLODipine (NORVASC) 5 MG tablet Take by mouth.    Marland Kitchen. aspirin EC 81 MG tablet Take by mouth.    Marland Kitchen. atenolol (TENORMIN) 100 MG tablet Take 25 mg by mouth daily.     Marland Kitchen. atorvastatin (LIPITOR) 80 MG tablet Take 10 mg by mouth every evening.     Marland Kitchen. b complex vitamins capsule Take 1 capsule by mouth daily.    Marland Kitchen. ELIQUIS 2.5 MG TABS tablet     . enoxaparin (LOVENOX) 40 MG/0.4ML injection Inject 0.4 mLs (40 mg total) into the skin daily.  12 Syringe 0  . ezetimibe (ZETIA) 10 MG tablet Take 10 mg by mouth at bedtime.    . Fe Fum-FePoly-FA-Vit C-Vit B3 (INTEGRA F) 125-1 MG CAPS Take 1 capsule by mouth daily.    . folic acid (FOLVITE) 400 MCG tablet Take by mouth.    . furosemide (LASIX) 40 MG tablet Take 20 mg by mouth daily.    . hydrochlorothiazide (HYDRODIURIL) 25 MG tablet Take 25 mg by mouth daily.    . isosorbide dinitrate (ISORDIL) 20 MG tablet Take 20 mg by mouth 3 (three) times daily.    . isosorbide-hydrALAZINE (BIDIL) 20-37.5 MG per tablet Take 1 tablet by mouth daily.    . methocarbamol (ROBAXIN) 500 MG tablet Take 1  tablet (500 mg total) by mouth every 6 (six) hours as needed. 60 tablet 2  . Multiple Vitamins-Minerals (CENTRUM SILVER PO) Take 1 tablet by mouth daily.    Marland Kitchen omeprazole (PRILOSEC) 20 MG capsule Take 20 mg by mouth daily.    Marland Kitchen oxyCODONE (OXY IR/ROXICODONE) 5 MG immediate release tablet Take 1-2 tablets (5-10 mg total) by mouth every 3 (three) hours as needed. 90 tablet 0  . OxyCODONE (OXYCONTIN) 20 mg T12A Take 1 tablet (20 mg total) by mouth every 12 (twelve) hours. 30 tablet 0  . celecoxib (CELEBREX) 200 MG capsule Take 1 capsule (200 mg total) by mouth every 12 (twelve) hours. (Patient not taking: Reported on 01/17/2019) 60 capsule 2   No current facility-administered medications for this visit.     Past Medical History:  Diagnosis Date  . AAA (abdominal aortic aneurysm) (HCC)    by Dr. Dulce Sellar notes, 4.3 cm by U/S 05/2012  . Arthritis   . CAD (coronary artery disease)   . Chronic kidney disease    followed by dr. fox for kidney function  . Coronary artery disease   . GERD (gastroesophageal reflux disease)    ulcer  . Hyperlipemia   . Hypertension   . Myocardial infarction [facility]) 1982    Social History Social History   Tobacco Use  . Smoking status: Never Smoker  . Smokeless tobacco: Never Used  Substance Use Topics  . Alcohol use: No  . Drug use: No    Family History Family History  Problem Relation Age of Onset  . Heart disease Father     Surgical History Past Surgical History:  Procedure Laterality Date  . CHOLECYSTECTOMY    . EYE SURGERY     bilateral cataracts  . FINGER AMPUTATION    . FINGER SURGERY     on right hand from trauma as child  . FRACTURE SURGERY Left    as a child  . kidney stents    . TOTAL KNEE ARTHROPLASTY  06/21/2012   Procedure: TOTAL KNEE ARTHROPLASTY;  Surgeon: Dannielle Huh, MD;  Location: [facility] OR;  Service: Orthopedics;  Laterality: Left;  . TOTAL KNEE ARTHROPLASTY Right 01/03/2013   Procedure: TOTAL KNEE ARTHROPLASTY;  Surgeon: Dannielle Huh, MD;  Location: [facility] OR;  Service: Orthopedics;  Laterality: Right;    Allergies  Allergen Reactions  . Ace Inhibitors   . Plavix [Clopidogrel]     hives    Current Outpatient Medications  Medication Sig Dispense Refill  . allopurinol (ZYLOPRIM) 100 MG tablet Take 100 mg by mouth 2 (two) times daily.    Marland Kitchen amLODipine (NORVASC) 5 MG tablet Take by mouth.    Marland Kitchen aspirin EC 81 MG tablet Take by mouth.    Marland Kitchen atenolol (TENORMIN) 100 MG  tablet Take 25 mg by mouth daily.     Marland Kitchen. atorvastatin (LIPITOR) 80 MG tablet Take 10 mg by mouth every evening.     Marland Kitchen. b complex vitamins capsule Take 1 capsule by mouth daily.    Marland Kitchen. ELIQUIS 2.5 MG TABS tablet     . enoxaparin (LOVENOX) 40 MG/0.4ML injection Inject 0.4 mLs (40 mg total) into the skin daily. 12 Syringe 0  . ezetimibe (ZETIA) 10 MG tablet Take 10 mg by mouth at bedtime.    . Fe Fum-FePoly-FA-Vit C-Vit B3 (INTEGRA F) 125-1 MG CAPS Take 1 capsule by mouth daily.    . folic acid (FOLVITE) 400 MCG tablet Take by mouth.    . furosemide (LASIX) 40 MG tablet Take 20 mg by mouth daily.    . hydrochlorothiazide (HYDRODIURIL) 25 MG tablet Take 25 mg by mouth daily.    . isosorbide dinitrate (ISORDIL) 20 MG tablet Take 20 mg by mouth 3 (three) times daily.    . isosorbide-hydrALAZINE (BIDIL) 20-37.5 MG per tablet Take 1 tablet by mouth daily.    . methocarbamol (ROBAXIN) 500 MG tablet Take 1 tablet (500 mg total) by mouth every 6 (six) hours as needed. 60 tablet 2  . Multiple Vitamins-Minerals (CENTRUM SILVER PO) Take 1 tablet by mouth daily.    Marland Kitchen. omeprazole (PRILOSEC) 20 MG capsule Take 20 mg by mouth daily.    Marland Kitchen. oxyCODONE (OXY IR/ROXICODONE) 5 MG immediate release tablet Take 1-2 tablets (5-10 mg total) by mouth every 3 (three) hours as needed. 90 tablet 0  . OxyCODONE (OXYCONTIN) 20 mg T12A Take 1 tablet (20 mg total) by mouth every 12 (twelve) hours. 30 tablet 0  . celecoxib (CELEBREX) 200 MG capsule Take 1 capsule (200 mg total) by mouth every 12  (twelve) hours. (Patient not taking: Reported on 01/17/2019) 60 capsule 2   No current facility-administered medications for this visit.      REVIEW OF SYSTEMS: See HPI for pertinent positives and negatives.  Physical Examination Vitals:   01/17/19 0926 01/17/19 0932  BP: (!) 189/101 (!) 168/86  Pulse: 78 81  Resp: 16   Temp: (!) 97 F (36.1 C)   TempSrc: Temporal   SpO2: 96%   Weight: 230 lb (104.3 kg)   Height: 5\' 7"  (1.702 m)    Body mass index is 36.02 kg/m.  General:  WDWN obese male in NAD Gait: Normal HENT: Large neck. Several small healing lesions on face.   Eyes: Pupils equal Pulmonary: normal non-labored breathing, good air movement in all fields, CTAB, no rales, rhonchi, or wheezing Cardiac: Irregular rhythm with controled rate, no murmur detected Abdomen: soft, NT, asymptomatic, reducible + ventral hernia with use of abdominal muscles. Skin: no rashes, no ulcers, no cellulitis.   VASCULAR EXAM  Carotid Bruits Right Left   Negative positive      Radial pulses are 2+ palpable bilaterally   Adominal aortic pulse is not palpable                      VASCULAR EXAM: Extremities without ischemic changes, without Gangrene; without open wounds.  LE Pulses Right Left       FEMORAL  2+ palpable  2+ palpable        POPLITEAL  not palpable   not palpable       POSTERIOR TIBIAL  2+ palpable   not palpable        DORSALIS PEDIS      ANTERIOR TIBIAL 2+ palpable  not palpable     Musculoskeletal: no muscle wasting or atrophy; no peripheral edema  Neurologic:  A&O X 3; appropriate affect, sensation is normal; speech is normal, CN 2-12 intact, pain and light touch intact in extremities, motor exam as listed above. Psychiatric: Normal thought content, mood appropriate to clinical situation.    ASSESSMENT:  [name] is a 84 y.o. male who presents with  an asymptomatic AAA with 3 mm increase in size, to 5.5 cm today, from 5.2 cm on 07-20-18.  See Plan.   Serum creatinine was 3.82 on 01-06-19.   He has no history of stroke or TIA. He has a left carotid bruit. Carotid duplex today shows a 40-59% bilateral ICA stenosis.   He had an MI in April 2020; his cardiologist is Dr. Bettina Gavia in [place], and more recetly Dr. Otho Perl in [facility] He states that he is scheduled for a pacemaker insertion on 01-24-19 by Dr. Dolores Lory.   His medications include Eliquis, 81 mg daily ASA, metoprolol, and atorvastatin.  He seems to have atrial fib, an irregular cardiac rhythm today, although it is not listed in his medical history.    DATA  Carotid Duplex (01-17-19): Right Carotid: Velocities in the right ICA are consistent with a 40-59%                stenosis. Hemodynamically significant plaque >50% visualized in                the CCA. Left Carotid: Velocities in the left ICA are consistent with a 40-59% stenosis. Vertebrals:  Bilateral vertebral arteries demonstrate antegrade flow. Subclavians: Normal flow hemodynamics were seen in bilateral subclavian              arteries.   AAA Duplex (01-17-19): Abdominal Aorta Findings: +-----------+-------+----------+----------+--------+--------+--------+ Location   AP (cm)Trans (cm)PSV (cm/s)WaveformThrombusComments +-----------+-------+----------+----------+--------+--------+--------+ Proximal   2.51   2.48      56                                 +-----------+-------+----------+----------+--------+--------+--------+ Mid        5.38   5.50      70                                 +-----------+-------+----------+----------+--------+--------+--------+ Distal     2.64   2.68      127                                +-----------+-------+----------+----------+--------+--------+--------+ RT CIA Prox1.0    1.2       137                                 +-----------+-------+----------+----------+--------+--------+--------+ LT CIA Prox1.2    1.6       147                                +-----------+-------+----------+----------+--------+--------+--------+  Summary: Abdominal Aorta: There is evidence of abnormal dilitation of the Mid Abdominal aorta. The largest aortic measurement is 5.5 cm. The largest aortic diameter has increased compared to prior exam. Previous diameter measurement was 5.2 cm obtained on 07/20/2018.   PLAN:   Based on today's exam and non-invasive vascular lab results, and after discussing with Dr. Myra GianottiBrabham, the patient will follow up in 2-4 weeks with the following tests: NON CONTRAST CTA abd/pelvis, see Dr. Darrick PennaFields afterward.  Carotid duplex in a year.   I discussed in depth with the patient the nature of atherosclerosis, and emphasized the importance of maximal medical management including strict control of blood pressure, blood glucose, and lipid levels, obtaining regular exercise, and cessation of smoking.  The patient is aware that without maximal medical management the underlying atherosclerotic disease process will progress, limiting the benefit of any interventions.  Consideration for repair of AAA would be made when the size approaches 5.0 cm, growth > 1 cm/yr, and symptomatic status. The patient was given information about AAA including signs, symptoms, treatment,  what symptoms should prompt the patient to seek immediate medical care, and how to minimize the risk of enlargement and rupture of aneurysms.   The patient was given information about stroke prevention and what symptoms should prompt the patient to seek immediate medical care.  The patient was given information about PAD including signs, symptoms, treatment, what symptoms should prompt the patient to seek immediate medical care, and risk reduction measures to take.  Thank you for allowing us to participate in this patient's care.  Charisse MarchSuzanne  Yashira Offenberger, RN, MSN, FNP-C [facility] Office: [phone]  Clinic MD: Myra GianottiBrabham 01/17/2019 10:08 AM

## 2019-01-18 ENCOUNTER — Other Ambulatory Visit: Payer: Self-pay

## 2019-01-18 DIAGNOSIS — I714 Abdominal aortic aneurysm, without rupture, unspecified: Secondary | ICD-10-CM

## 2019-02-10 ENCOUNTER — Other Ambulatory Visit: Payer: Medicare Other

## 2019-02-17 ENCOUNTER — Ambulatory Visit: Payer: Medicare Other | Admitting: Vascular Surgery

## 2019-03-24 ENCOUNTER — Other Ambulatory Visit: Payer: Medicare Other

## 2019-03-31 ENCOUNTER — Ambulatory Visit: Payer: Medicare Other | Admitting: Vascular Surgery

## 2021-12-02 DEATH — deceased
